# Patient Record
Sex: Female | Born: 1950 | Hispanic: No | Marital: Married | Smoking: Never smoker
Health system: Southern US, Community
[De-identification: ages and names within clinical notes are randomized; demographics above are authoritative.]

## PROBLEM LIST (undated history)

## (undated) DIAGNOSIS — I10 Essential (primary) hypertension: Secondary | ICD-10-CM

## (undated) DIAGNOSIS — C73 Malignant neoplasm of thyroid gland: Secondary | ICD-10-CM

## (undated) DIAGNOSIS — E039 Hypothyroidism, unspecified: Secondary | ICD-10-CM

## (undated) DIAGNOSIS — E781 Pure hyperglyceridemia: Secondary | ICD-10-CM

## (undated) DIAGNOSIS — G600 Hereditary motor and sensory neuropathy: Secondary | ICD-10-CM

## (undated) HISTORY — DX: Hypothyroidism, unspecified: E03.9

## (undated) HISTORY — DX: Essential (primary) hypertension: I10

## (undated) HISTORY — DX: Pure hyperglyceridemia: E78.1

## (undated) HISTORY — DX: Hereditary motor and sensory neuropathy: G60.0

## (undated) HISTORY — DX: Malignant neoplasm of thyroid gland: C73

---

## 1997-08-27 ENCOUNTER — Ambulatory Visit (HOSPITAL_COMMUNITY): Admission: RE | Admit: 1997-08-27 | Discharge: 1997-08-27 | Payer: Self-pay | Admitting: Otolaryngology

## 1997-09-07 ENCOUNTER — Ambulatory Visit: Admission: RE | Admit: 1997-09-07 | Discharge: 1997-09-07 | Payer: Self-pay | Admitting: Otolaryngology

## 1999-07-04 ENCOUNTER — Encounter (INDEPENDENT_AMBULATORY_CARE_PROVIDER_SITE_OTHER): Payer: Self-pay | Admitting: Specialist

## 1999-07-04 ENCOUNTER — Inpatient Hospital Stay (HOSPITAL_COMMUNITY): Admission: EM | Admit: 1999-07-04 | Discharge: 1999-07-05 | Payer: Self-pay | Admitting: Emergency Medicine

## 2001-03-04 ENCOUNTER — Encounter: Payer: Self-pay | Admitting: Emergency Medicine

## 2001-03-04 ENCOUNTER — Emergency Department (HOSPITAL_COMMUNITY): Admission: EM | Admit: 2001-03-04 | Discharge: 2001-03-04 | Payer: Self-pay | Admitting: Emergency Medicine

## 2003-08-08 ENCOUNTER — Encounter: Admission: RE | Admit: 2003-08-08 | Discharge: 2003-08-08 | Payer: Self-pay | Admitting: Family Medicine

## 2003-10-24 ENCOUNTER — Other Ambulatory Visit: Admission: RE | Admit: 2003-10-24 | Discharge: 2003-10-24 | Payer: Self-pay | Admitting: Family Medicine

## 2004-09-04 ENCOUNTER — Encounter: Admission: RE | Admit: 2004-09-04 | Discharge: 2004-09-04 | Payer: Self-pay | Admitting: Family Medicine

## 2004-10-24 ENCOUNTER — Other Ambulatory Visit: Admission: RE | Admit: 2004-10-24 | Discharge: 2004-10-24 | Payer: Self-pay | Admitting: Family Medicine

## 2004-10-28 ENCOUNTER — Ambulatory Visit (HOSPITAL_BASED_OUTPATIENT_CLINIC_OR_DEPARTMENT_OTHER): Admission: RE | Admit: 2004-10-28 | Discharge: 2004-10-28 | Payer: Self-pay | Admitting: Orthopedic Surgery

## 2004-10-28 ENCOUNTER — Ambulatory Visit (HOSPITAL_COMMUNITY): Admission: RE | Admit: 2004-10-28 | Discharge: 2004-10-28 | Payer: Self-pay | Admitting: Orthopedic Surgery

## 2005-12-08 ENCOUNTER — Other Ambulatory Visit: Admission: RE | Admit: 2005-12-08 | Discharge: 2005-12-08 | Payer: Self-pay | Admitting: Family Medicine

## 2005-12-11 ENCOUNTER — Encounter: Admission: RE | Admit: 2005-12-11 | Discharge: 2005-12-11 | Payer: Self-pay | Admitting: Family Medicine

## 2006-01-11 ENCOUNTER — Encounter: Admission: RE | Admit: 2006-01-11 | Discharge: 2006-01-11 | Payer: Self-pay | Admitting: Endocrinology

## 2006-01-11 ENCOUNTER — Other Ambulatory Visit: Admission: RE | Admit: 2006-01-11 | Discharge: 2006-01-11 | Payer: Self-pay | Admitting: Interventional Radiology

## 2006-01-11 ENCOUNTER — Encounter (INDEPENDENT_AMBULATORY_CARE_PROVIDER_SITE_OTHER): Payer: Self-pay | Admitting: Specialist

## 2006-03-29 ENCOUNTER — Encounter (INDEPENDENT_AMBULATORY_CARE_PROVIDER_SITE_OTHER): Payer: Self-pay | Admitting: *Deleted

## 2006-03-29 ENCOUNTER — Ambulatory Visit (HOSPITAL_COMMUNITY): Admission: RE | Admit: 2006-03-29 | Discharge: 2006-03-30 | Payer: Self-pay

## 2006-04-29 ENCOUNTER — Encounter: Admission: RE | Admit: 2006-04-29 | Discharge: 2006-04-29 | Payer: Self-pay | Admitting: Family Medicine

## 2006-10-18 ENCOUNTER — Other Ambulatory Visit: Admission: RE | Admit: 2006-10-18 | Discharge: 2006-10-18 | Payer: Self-pay | Admitting: Obstetrics and Gynecology

## 2007-03-11 ENCOUNTER — Encounter: Admission: RE | Admit: 2007-03-11 | Discharge: 2007-03-11 | Payer: Self-pay | Admitting: Family Medicine

## 2007-10-17 ENCOUNTER — Encounter: Admission: RE | Admit: 2007-10-17 | Discharge: 2007-10-17 | Payer: Self-pay | Admitting: Family Medicine

## 2007-12-13 ENCOUNTER — Encounter: Admission: RE | Admit: 2007-12-13 | Discharge: 2007-12-13 | Payer: Self-pay | Admitting: Family Medicine

## 2008-01-19 ENCOUNTER — Other Ambulatory Visit: Admission: RE | Admit: 2008-01-19 | Discharge: 2008-01-19 | Payer: Self-pay | Admitting: Family Medicine

## 2009-02-25 ENCOUNTER — Other Ambulatory Visit: Admission: RE | Admit: 2009-02-25 | Discharge: 2009-02-25 | Payer: Self-pay | Admitting: Family Medicine

## 2010-03-11 ENCOUNTER — Emergency Department (HOSPITAL_COMMUNITY)
Admission: EM | Admit: 2010-03-11 | Discharge: 2010-03-11 | Payer: Self-pay | Source: Home / Self Care | Admitting: Emergency Medicine

## 2010-04-09 ENCOUNTER — Encounter: Payer: Self-pay | Admitting: Family Medicine

## 2010-07-25 NOTE — Op Note (Signed)
NAMEDarrelyn Hillock           ACCOUNT NO.:  [id number]   MEDICAL RECORD NO.:  [id number]          PATIENT TYPE:  AMB   LOCATION:  DAY                          FACILITY:  [facility]   PHYSICIAN:  Lebron Conners, M.D.   DATE OF BIRTH:  [birth year]   DATE OF PROCEDURE:  03/29/2006  DATE OF DISCHARGE:                               OPERATIVE REPORT   PREOPERATIVE DIAGNOSIS:  Left thyroid nodules.   POSTOPERATIVE DIAGNOSIS:  Benign left thyroid nodules.   OPERATIVE:  Left thyroid lobectomy and excision of lymph nodes from the  pretracheal space.   SURGEON:  Dr. Lebron Conners.   ASSISTANT:  Dr. Leonie Man.   ANESTHESIA:  General.   SPECIMEN:  Left thyroid lobe and pretracheal lymph nodes.   ESTIMATED BLOOD LOSS:  100 mL.   COMPLICATIONS:  None.  The patient to PACU in good condition.   DESCRIPTION OF PROCEDURE:  After the patient was monitored and asleep,  positioned with slight extension of the neck and propping the shoulders  up on a roll, I prepped and draped the anterior neck and made a collar-  type incision about 5 cm in length centered on the midline about 1-2 cm  above the clavicle. I deepened that under the platysma.  I injured one  anterior jugular vein and ligated it.  I then developed flaps beneath  the platysma cephalad to the thyroid cartilage and caudad to the sternal  notch going under the sternocleidomastoid muscles slightly.  I put in a  Mahorner retractor then incised the midline of the neck and visualized  the isthmus of the thyroid.  The right lobe felt normal.  There was  nodularity of the left lobe.  I developed a space between the strap  muscles and the anterior surface of the thyroid and noted a couple of  small veins coursing laterally and clipped and divided them.  I then  dissected out the superior thyroid vessels ligating them in continuity  close to the thyroid gland and clipping them as well.  I then had good  mobility of the superior pole.  As  I dissected laterally,  I saw the  recurrent nerve early in the dissection.  It was fairly adherent to the  posterolateral surface of the thyroid gland and I very carefully  dissected it away.  I controlled bleeding in the area of the nerve with  clips placed parallel to it and application of Surgicel. I saw both  inferior and superior parathyroids and spared them.  I dissected the  thyroid gland medially and then with the last part of the dissection it  dissecting off the trachea with the Bovie.  I clamped across the isthmus  and removed the left lobe, oriented it with a suture and sent it for  frozen section.  I suture ligated the isthmus with 3-0 Vicryl.  I then  got good hemostasis with cautery and application of Surgicel.  I found a  couple of lymph nodes just inferior to the isthmus which was slightly  enlarged and I removed them and I also found a Delphian node just above  the  isthmus and excised that as well and sent them for permanent section  examination.  After I was satisfied with hemostasis and  the diagnosis that the nodule was evidently benign had been received, I  closed the midline with running 3-0 Vicryl and closed with the platysma  with running 3-0 Vicryl and closed the skin with running intracuticular  4-0 Vicryl reinforced by Steri-Strips.  The patient was stable  throughout the procedure.      Lebron Conners, M.D.  Electronically Signed     WB/MEDQ  D:  03/29/2006  T:  03/29/2006  Job:  [id number]   cc:   Donia Guiles, M.D.  Fax: [phone]

## 2010-07-25 NOTE — Op Note (Signed)
NAMEDarrelyn Hillock           ACCOUNT NO.:  [id number]   MEDICAL RECORD NO.:  [id number]          PATIENT TYPE:  AMB   LOCATION:  DSC                          FACILITY:  [facility]   PHYSICIAN:  Leonides Grills, M.D.     DATE OF BIRTH:  1950-10-01   DATE OF PROCEDURE:  10/28/2004  DATE OF DISCHARGE:                                 OPERATIVE REPORT   PREOPERATIVE DIAGNOSES:  1.  Left peroneus brevis tendon tear.  2.  Subluxing left peroneal tendon.  3.  Left peroneal tenosynovitis.   POSTOPERATIVE DIAGNOSES:  1.  Left peroneus brevis tendon tear.  2.  Subluxing left peroneal tendon.  3.  Left peroneal tenosynovitis.   OPERATION:  1.  Repair of left subluxing peroneal tendons without fibular osteotomy.  2.  Repair, left peroneus brevis tendon tear.  3.  Excision/debridement, left peroneus quadratus tendon.   ANESTHESIA:  General.   SURGEON:  Leonides Grills, M.D.   ASSISTANT:  Lianne Cure, P.A.   ESTIMATED BLOOD LOSS:  Minimal.   TOURNIQUET TIME:  Approximately 45 minutes.   COMPLICATIONS:  None.   DISPOSITION:  Stable to the PR.   INDICATIONS:  This is a 60 year old female with longstanding posterolateral  left ankle pain.  On MRI, she was found to have a tendon tear with  tendinopathy and with a subtle subluxation.  She was consented for the above  procedure.  All risks, which included infection, nerve and vessel injury,  system pain, worsening pain, weakness, prolonged recovery, possible future  surgery, were all explained.  Questions were encouraged and answered.   OPERATION:  Patient was brought to the operating room.  Placed in a supine  position.  After adequate general endotracheal anesthesia was administered  as well as Ancef 1 gm IV piggyback, the left lower extremity was then  prepped and draped in a sterile manner over a proximally placed thigh  tourniquet.  After the patient was placed in a lateral decubitus position  with the operative side up with axial  ___________ placed.  All bony  prominences were well padded.  Once the left lower extremity was prepped and  draped in the usual sterile manner, a curvilinear incision was then made  over the left lateral aspect of the ankle.  Dissection was carried down  through the skin.  Hemostasis was obtained.  The sural nerve was identified,  and femoral sural nerve neurolysis was then performed.  The nerve and soft  tissues posteriorly were then gently retracted.  The retinaculum over the  peroneal tendon was then identified proximally just 1-2 mm just posterior to  the lateral malleolus border.  An incision was then made in the retinaculum,  and the retinaculum was incised.  There was a tremendous amount of synovitis  within the peroneal tendon.  Both the peroneus brevis and peroneus longus  tendons had tendinopathy, more in the longus than the brevis.  There was no  distinct tear in the longus tendon that we could identify.  The inferior  peroneal retinaculum was also tight as well, and this was released and  debrided.  The peroneus brevis  tendon had a tear, and then this was repaired  with a cannulated-type stitch using 5-0 nylon suture with outstanding  repair.  Prior to repairing it, the tear was debrided.  There was a peroneus  quadratus tendon as well as muscle bed traversing the peroneal compartment  and this, as well as the tenosynovitis around the area was completely  debrided and debulked.  We then created a bed in the posterolateral quarter  of the lateral malleolus with the curved 1/4 inch osteotome and a rongeur.  We then made multiple drill holes to pass the suture and with #2 fiber wire,  the peroneal retinaculum was then repaired, and this had an outstanding  repair.  Then 2-0 fiber wire was then used to finish up the peroneal  retinaculum repair, protecting the peroneal tendons with the Therapist, nutritional.  Prior to repair, the posterior aspect of the lateral malleolus was palpated.   This had adequate cavity, and there was no palpable bony prominence.  Right  after the repair, the ankle was ranged.  The tendons moved freely.  There  was no binding.  The area was copiously irrigated with normal saline.  The  tourniquet was inflated.  Hemostasis was obtained.  Subcu was closed with 3-  0 Vicryls, cognizant of the sural nerve.  Skin was closed with 4-0 nylon.  Sterile dressing was applied.  A modified Jones dressing was applied.  Patient was stable to the PR.      Leonides Grills, M.D.  Electronically Signed     PB/MEDQ  D:  10/28/2004  T:  10/28/2004  Job:  [id number]

## 2010-07-25 NOTE — Op Note (Signed)
[facility]  Patient:    Julia Cole                    MRN: [id number] Proc. Date: 07/04/99 Adm. Date:  [id number] Disc. Date: [id number] Attending:  Meredith Leeds                           Operative Report  PREOPERATIVE DIAGNOSIS:  Acute appendicitis.  POSTOPERATIVE DIAGNOSIS:  Acute appendicitis.  OPERATION PERFORMED:  Laparoscopic appendectomy.  SURGEON:  Zigmund Daniel, M.D.  ANESTHESIA:  General.  DESCRIPTION OF PROCEDURE:  After adequate monitoring and anesthesia and routine  preparation and draping of the abdomen and after placement of a Foley catheter, I made a small infraumbilical incision and bluntly entered the peritoneum after incising the fascia longitudinally and then placed a 0 Vicryl pursestring suture and secured a Hasson cannula.  I inflated the abdomen with CO2 and saw inflammation lateral to the cecum and saw no other abnormalities.  I put in a 5 mm right upper quadrant port and a 12 mm left lower quadrant port and dissected away the adhesions.  The appendix was adherent slightly behind and lateral to the cecum ut I was able to dissect it away with little bleeding.  It was moderately inflamed in the midportion and at the tip but the base was healthy.  After dissecting it up  until I had a nice window through between the artery and lateral to the artery, I stapled the appendix and the appendiceal artery with one firing of the endoscopic stapler. That amputated it nicely.  The cecum appeared to be healthy. Hemostasis was good.  I removed the appendix through the left lower quadrant port by pulling it up into the cannula and removing the cannula.  I inspected the sites and found that there was no bleeding.  I removed the CO2 and tied the pursestring suture,  then closed all the skin incisions with intracuticular 4-0 Vicryl and Steri-Strips. The patient tolerated the operation well. DD:  07/04/99 TD:   07/08/99 Job: [id number]

## 2011-07-16 ENCOUNTER — Other Ambulatory Visit: Payer: Self-pay | Admitting: Family Medicine

## 2011-07-16 DIAGNOSIS — E049 Nontoxic goiter, unspecified: Secondary | ICD-10-CM

## 2011-07-17 ENCOUNTER — Ambulatory Visit
Admission: RE | Admit: 2011-07-17 | Discharge: 2011-07-17 | Disposition: A | Discharge status: Home / Self Care | Payer: Medicare Other | Source: Ambulatory Visit | Attending: Family Medicine | Admitting: Family Medicine

## 2011-07-17 DIAGNOSIS — E049 Nontoxic goiter, unspecified: Secondary | ICD-10-CM

## 2011-08-13 ENCOUNTER — Other Ambulatory Visit (HOSPITAL_COMMUNITY)
Admission: RE | Admit: 2011-08-13 | Discharge: 2011-08-13 | Disposition: A | Discharge status: Home / Self Care | Payer: Medicare Other | Source: Ambulatory Visit | Attending: Family Medicine | Admitting: Family Medicine

## 2011-08-13 ENCOUNTER — Other Ambulatory Visit: Payer: Self-pay | Admitting: Family Medicine

## 2011-08-13 DIAGNOSIS — Z124 Encounter for screening for malignant neoplasm of cervix: Secondary | ICD-10-CM | POA: Insufficient documentation

## 2011-12-31 IMAGING — CR DG LUMBAR SPINE COMPLETE 4+V
5 series · 5 of 5 positions shown · non-contrast
Comparison: None

CLINICAL DATA: Fell.  Back pain.

LUMBAR SPINE - COMPLETE 4+ VIEW

[t l-spine a.p.]
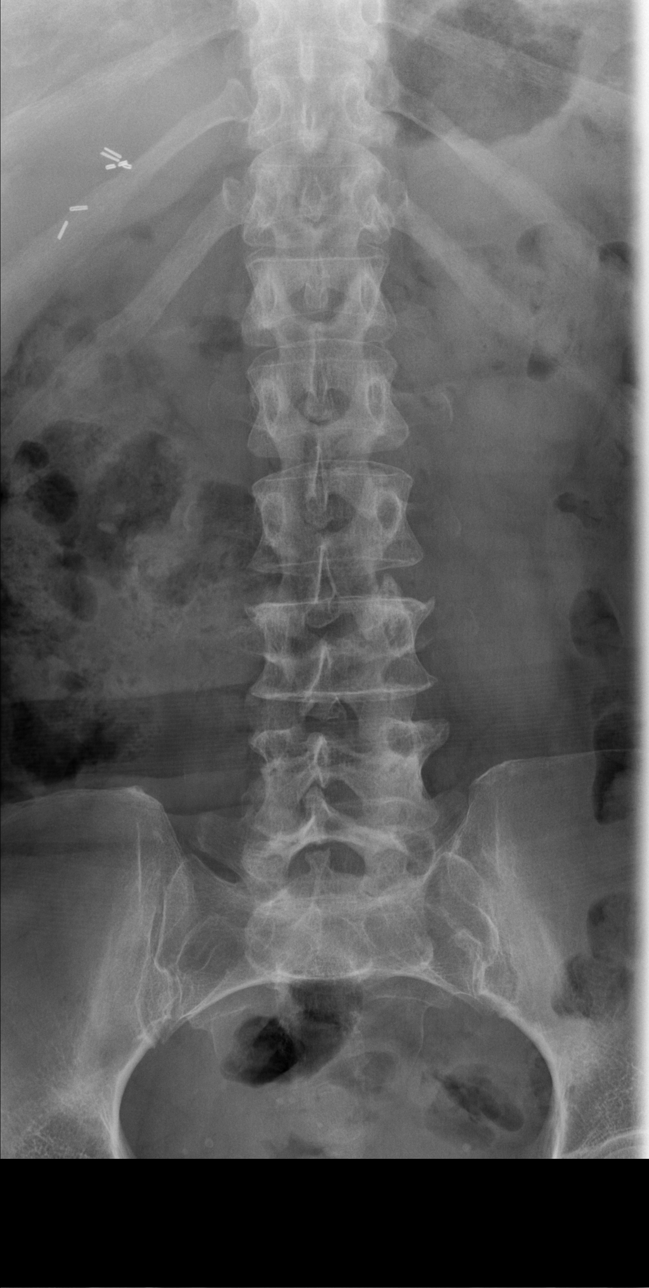

[t l-spine oblique exposure (1 of 2)]
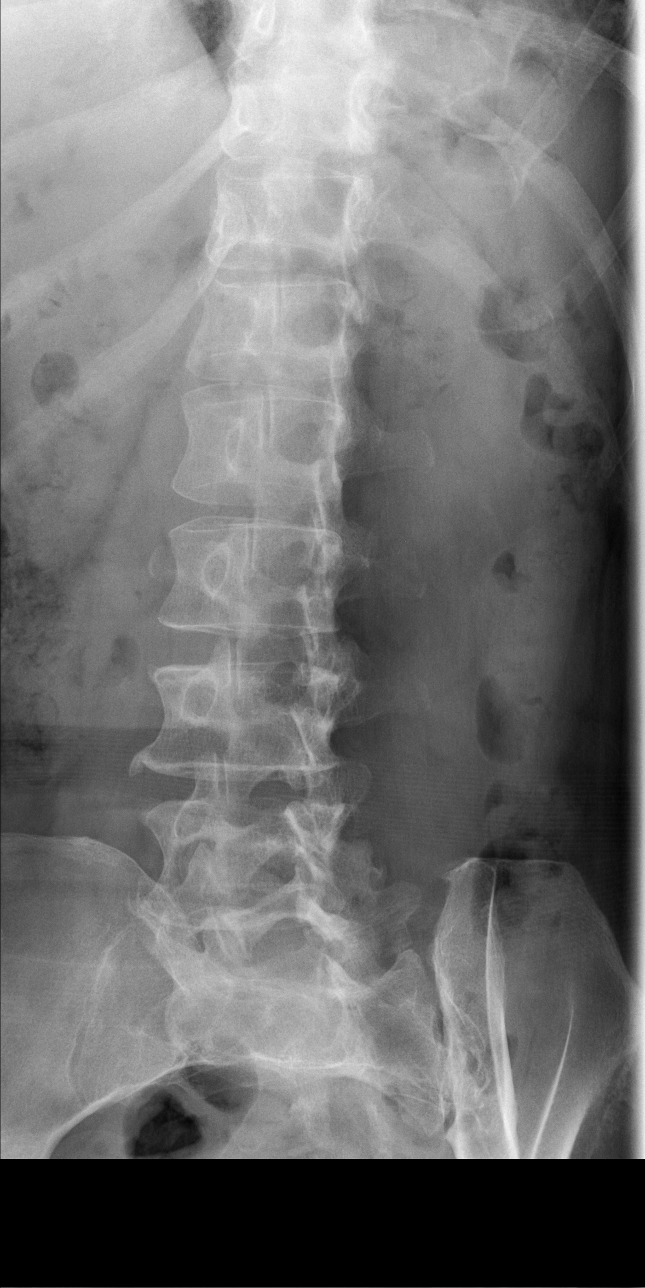

[t l-spine oblique exposure (2 of 2)]
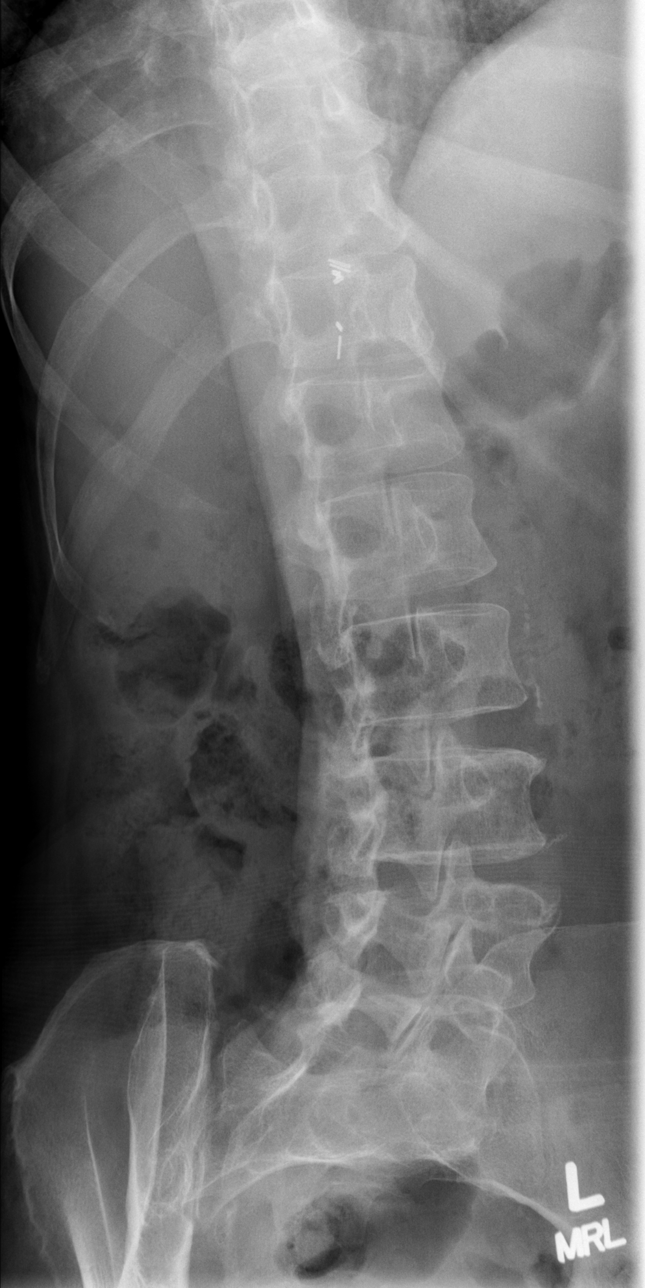

[t l-spine lat]
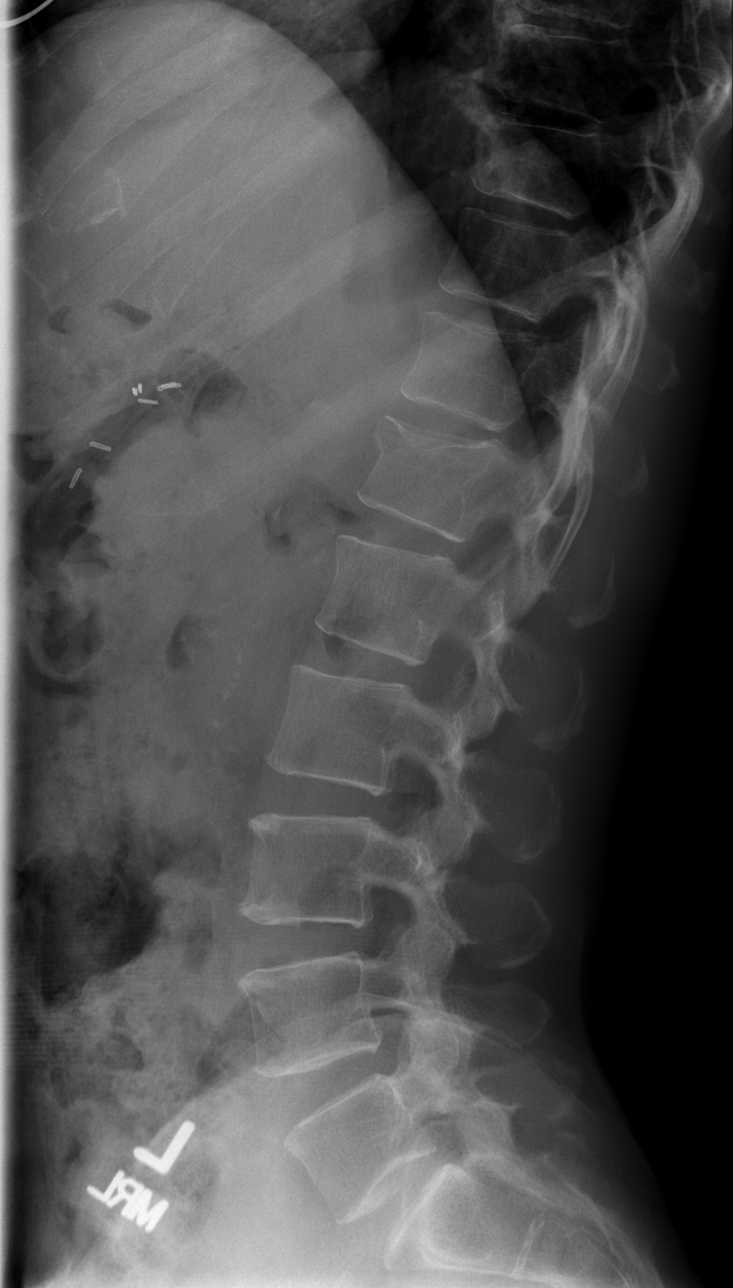

[t l-spine l5-s1 spot]
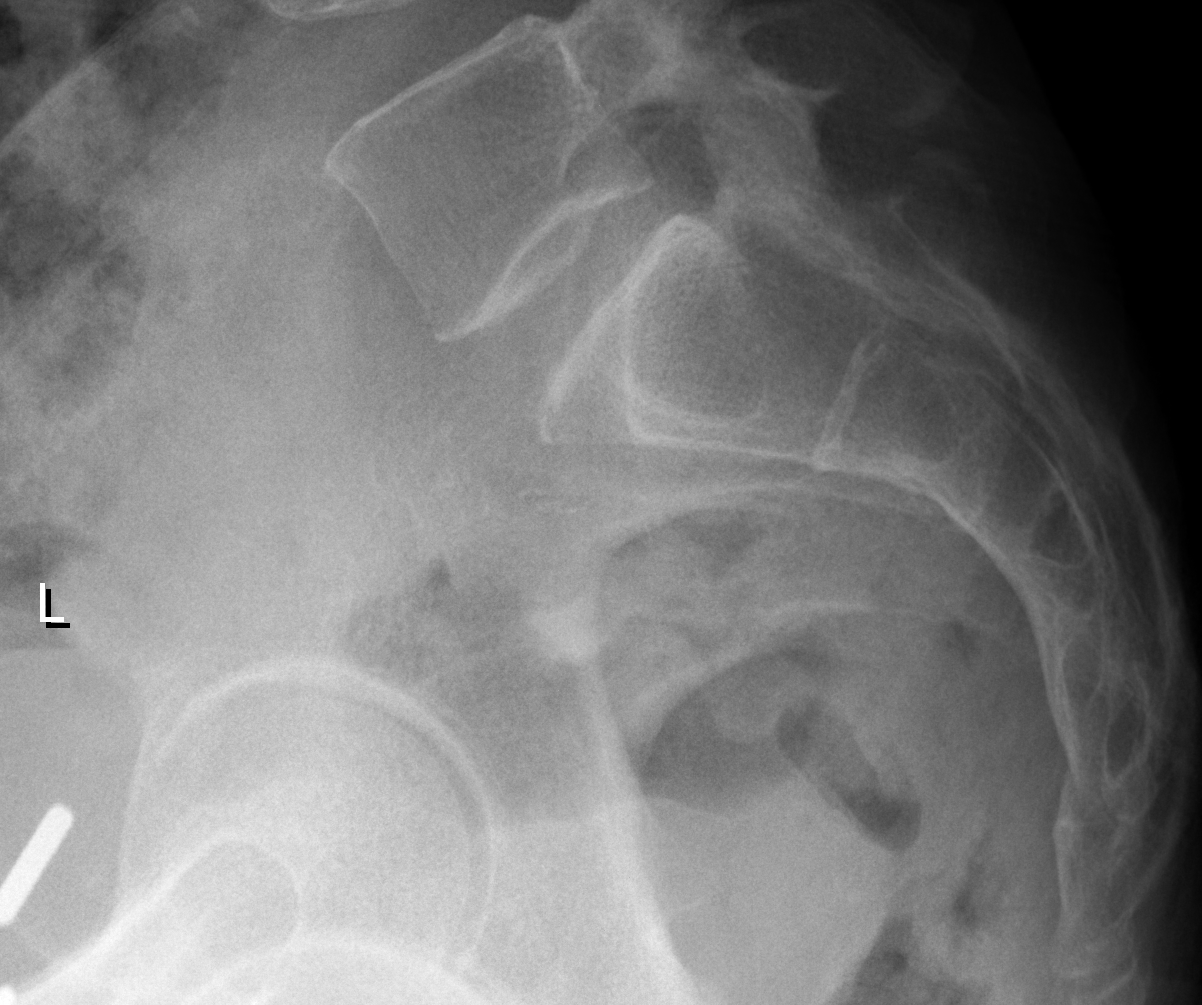

[5 of 5 positions shown; findings below may reference images not displayed]

FINDINGS: The lateral film demonstrates normal alignment.  There is
lumbarization of the S1 vertebral body.  There is a mild
compression fracture of L1.  No retropulsion.  No pars defects.
IMPRESSION: 1.  L1 compression fracture.
2.  Lumbarization of S1.

## 2012-08-18 ENCOUNTER — Other Ambulatory Visit (HOSPITAL_COMMUNITY)
Admission: RE | Admit: 2012-08-18 | Discharge: 2012-08-18 | Disposition: A | Discharge status: Home / Self Care | Payer: Medicare Other | Source: Ambulatory Visit | Attending: Family Medicine | Admitting: Family Medicine

## 2012-08-18 ENCOUNTER — Other Ambulatory Visit: Payer: Self-pay | Admitting: Family Medicine

## 2012-08-18 DIAGNOSIS — Z124 Encounter for screening for malignant neoplasm of cervix: Secondary | ICD-10-CM | POA: Insufficient documentation

## 2012-08-18 DIAGNOSIS — Z1151 Encounter for screening for human papillomavirus (HPV): Secondary | ICD-10-CM | POA: Insufficient documentation

## 2014-12-18 ENCOUNTER — Other Ambulatory Visit: Payer: Self-pay | Admitting: Family Medicine

## 2014-12-18 ENCOUNTER — Other Ambulatory Visit (HOSPITAL_COMMUNITY)
Admission: RE | Admit: 2014-12-18 | Discharge: 2014-12-18 | Disposition: A | Discharge status: Home / Self Care | Payer: Medicare Other | Source: Ambulatory Visit | Attending: Family Medicine | Admitting: Family Medicine

## 2014-12-18 DIAGNOSIS — Z124 Encounter for screening for malignant neoplasm of cervix: Secondary | ICD-10-CM | POA: Insufficient documentation

## 2014-12-18 DIAGNOSIS — Z1151 Encounter for screening for human papillomavirus (HPV): Secondary | ICD-10-CM | POA: Insufficient documentation

## 2014-12-21 LAB — CYTOLOGY - PAP

## 2015-11-14 ENCOUNTER — Other Ambulatory Visit: Payer: Self-pay | Admitting: Endocrinology

## 2015-11-14 DIAGNOSIS — C73 Malignant neoplasm of thyroid gland: Secondary | ICD-10-CM

## 2015-11-28 ENCOUNTER — Ambulatory Visit
Admission: RE | Admit: 2015-11-28 | Discharge: 2015-11-28 | Disposition: A | Discharge status: Home / Self Care | Payer: Medicare Other | Source: Ambulatory Visit | Attending: Endocrinology | Admitting: Endocrinology

## 2015-11-28 DIAGNOSIS — C73 Malignant neoplasm of thyroid gland: Secondary | ICD-10-CM

## 2017-01-06 ENCOUNTER — Other Ambulatory Visit: Payer: Self-pay | Admitting: Family Medicine

## 2017-01-06 ENCOUNTER — Other Ambulatory Visit (HOSPITAL_COMMUNITY)
Admission: RE | Admit: 2017-01-06 | Discharge: 2017-01-06 | Disposition: A | Discharge status: Home / Self Care | Payer: Medicare Other | Source: Ambulatory Visit | Attending: Family Medicine | Admitting: Family Medicine

## 2017-01-06 DIAGNOSIS — Z124 Encounter for screening for malignant neoplasm of cervix: Secondary | ICD-10-CM | POA: Diagnosis present

## 2017-01-08 LAB — CYTOLOGY - PAP
DIAGNOSIS: NEGATIVE
HPV: NOT DETECTED

## 2017-08-10 ENCOUNTER — Other Ambulatory Visit: Payer: Self-pay | Admitting: Gastroenterology

## 2017-08-10 DIAGNOSIS — R1310 Dysphagia, unspecified: Secondary | ICD-10-CM

## 2017-08-11 ENCOUNTER — Ambulatory Visit
Admission: RE | Admit: 2017-08-11 | Discharge: 2017-08-11 | Disposition: A | Discharge status: Home / Self Care | Payer: Medicare Other | Source: Ambulatory Visit | Attending: Gastroenterology | Admitting: Gastroenterology

## 2017-08-11 DIAGNOSIS — R1310 Dysphagia, unspecified: Secondary | ICD-10-CM

## 2019-03-07 ENCOUNTER — Other Ambulatory Visit: Payer: Self-pay | Admitting: Family Medicine

## 2019-03-07 DIAGNOSIS — I739 Peripheral vascular disease, unspecified: Secondary | ICD-10-CM

## 2019-03-14 ENCOUNTER — Ambulatory Visit
Admission: RE | Admit: 2019-03-14 | Discharge: 2019-03-14 | Disposition: A | Discharge status: Home / Self Care | Payer: Medicare Other | Source: Ambulatory Visit | Attending: Family Medicine | Admitting: Family Medicine

## 2019-03-14 DIAGNOSIS — I739 Peripheral vascular disease, unspecified: Secondary | ICD-10-CM

## 2019-11-20 ENCOUNTER — Other Ambulatory Visit: Payer: Self-pay | Admitting: Endocrinology

## 2019-11-20 DIAGNOSIS — C73 Malignant neoplasm of thyroid gland: Secondary | ICD-10-CM

## 2019-12-01 ENCOUNTER — Ambulatory Visit
Admission: RE | Admit: 2019-12-01 | Discharge: 2019-12-01 | Disposition: A | Discharge status: Home / Self Care | Payer: Medicare Other | Source: Ambulatory Visit | Attending: Endocrinology | Admitting: Endocrinology

## 2019-12-01 DIAGNOSIS — C73 Malignant neoplasm of thyroid gland: Secondary | ICD-10-CM

## 2020-02-16 ENCOUNTER — Other Ambulatory Visit: Payer: Self-pay

## 2020-02-16 ENCOUNTER — Ambulatory Visit: Payer: Medicare Other | Admitting: Internal Medicine

## 2020-02-16 ENCOUNTER — Encounter: Payer: Self-pay | Admitting: Internal Medicine

## 2020-02-16 VITALS — BP 114/70 | HR 78 | Ht 61.5 in | Wt 147.4 lb

## 2020-02-16 DIAGNOSIS — E039 Hypothyroidism, unspecified: Secondary | ICD-10-CM

## 2020-02-16 DIAGNOSIS — E783 Hyperchylomicronemia: Secondary | ICD-10-CM

## 2020-02-16 DIAGNOSIS — Z8249 Family history of ischemic heart disease and other diseases of the circulatory system: Secondary | ICD-10-CM

## 2020-02-16 NOTE — Patient Instructions (Addendum)
Medication Instructions:  Your physician recommends that you continue on your current medications as directed. Please refer to the Current Medication list given to you today.  *If you need a refill on your cardiac medications before your next appointment, please call your pharmacy*   Lab Work: Your physician recommends that you return for lab work in: Lipid profile/NMR/Apo B  If you have labs (blood work) drawn today and your tests are completely normal, you will receive your results only by: Marland Kitchen MyChart Message (if you have MyChart) OR . A paper copy in the mail If you have any lab test that is abnormal or we need to change your treatment, we will call you to review the results.   Follow-Up: At [facility] you and your health needs are our priority.  As part of our continuing mission to provide you with exceptional heart care, we have created designated Provider Care Teams.  These Care Teams include your primary Cardiologist (physician) and Advanced Practice Providers (APPs -  Physician Assistants and Nurse Practitioners) who all work together to provide you with the care you need, when you need it.  We recommend signing up for the patient portal called "MyChart".  Sign up information is provided on this After Visit Summary.  MyChart is used to connect with patients for Virtual Visits (Telemedicine).  Patients are able to view lab/test results, encounter notes, upcoming appointments, etc.  Non-urgent messages can be sent to your provider as well.   To learn more about what you can do with MyChart, go to [email or web address].    Your next appointment:   3 month(s)  The format for your next appointment:   In Person  Provider:   K. Mali Hilty, MD  Follow up made with the lipid clinic.

## 2020-02-16 NOTE — Progress Notes (Signed)
LIPID CLINIC CONSULT NOTE  Chief Complaint:  High triglycerides  Primary Care Physician: Mayra Neer, MD  Primary Cardiologist:  No primary care provider on file.  HPI:  Julia Cole is a 69 y.o. female who is being seen today for the evaluation of high triglycerides at the request of Mayra Neer, MD.  This is a pleasant 69 year old female kindly referred by Dr. Brigitte Pulse for evaluation and management of high triglycerides.  This is apparently a longstanding problem and she has been on different medications for in the past.  Particularly she has been on simvastatin and more recently on fenofibrate.  Her last lipids were assessed in October showing total cholesterol 219, HDL 40, LDL 53 and triglycerides 897.  She reports triglycerides over thousand in the past.  She says that her father had heart disease and died of an MI in his 19s but was a heavy smoker.  He also had high cholesterol.  She is physically active still participating in spin classes but reports she gets short of breath and fatigue much easier.  She also has a history of thyroid disorder with partial thyroidectomy on thyroid replacement followed by Dr. Chalmers Cater.  She denies tobacco or alcohol use.  She reports a varied diet but does not necessarily restrict saturated fats to low levels.  She denies history of pancreatitis.  PMHx:  Past Medical History:  Diagnosis Date  . CMT (Charcot-Marie-Tooth disease)   . High triglycerides   . Hypertension   . Hypothyroidism   . Thyroid cancer ([place])     History reviewed. No pertinent surgical history.  FAMHx:  Family History  Problem Relation Age of Onset  . Heart attack Mother     SOCHx:   reports that she has never smoked. She has never used smokeless tobacco. No history on file for alcohol use and drug use.  ALLERGIES:  No Known Allergies  ROS: Pertinent items noted in HPI and remainder of comprehensive ROS otherwise negative.  HOME MEDS: Current Outpatient  Medications on File Prior to Visit  Medication Sig Dispense Refill  . ASPIRIN 81 PO 1 tablet    . cholecalciferol (VITAMIN D3) 25 MCG (1000 UNIT) tablet 1 tablet    . fenofibrate 160 MG tablet     . levothyroxine (SYNTHROID) 25 MCG tablet Take 25 mcg by mouth daily.    Marland Kitchen levothyroxine (SYNTHROID) 88 MCG tablet TAKE 1 TABLET BY MOUTH DAILY ON AN EMPTY STOMACH IN THE MORNING 90    . linaclotide (LINZESS) 72 MCG capsule prn    . metroNIDAZOLE (FLAGYL) 250 MG tablet Take 250 mg by mouth 3 (three) times daily.    . ramipril (ALTACE) 10 MG capsule 1 capsule    . simvastatin (ZOCOR) 40 MG tablet Take 40 mg by mouth at bedtime.    . triamterene-hydrochlorothiazide (MAXZIDE-25) 37.5-25 MG tablet 1 tablet in the morning     No current facility-administered medications on file prior to visit.    LABS/IMAGING: No results found for this or any previous visit (from the past 48 hour(s)). No results found.  LIPID PANEL: No results found for: CHOL, TRIG, HDL, CHOLHDL, VLDL, LDLCALC, LDLDIRECT  WEIGHTS: Wt Readings from Last 3 Encounters:  02/16/20 147 lb 6.4 oz (66.9 kg)    VITALS: BP 114/70   Pulse 78   Ht 5' 1.5" (1.562 m)   Wt 147 lb 6.4 oz (66.9 kg)   BMI 27.40 kg/m   EXAM: General appearance: alert and no distress Neck: no carotid  bruit, no JVD and thyroid not enlarged, symmetric, no tenderness/mass/nodules Lungs: clear to auscultation bilaterally Heart: regular rate and rhythm, S1, S2 normal, no murmur, click, rub or gallop Abdomen: soft, non-tender; bowel sounds normal; no masses,  no organomegaly Extremities: extremities normal, atraumatic, no cyanosis or edema Pulses: 2+ and symmetric Skin: Skin color, texture, turgor normal. No rashes or lesions Neurologic: Grossly normal Psych: Pleasant  EKG: Deferred  ASSESSMENT: 1. Hyperchylomicronemia 2. Hypothyroidism with history of thyroid CA and prior partial thyroidectomy 3. Family history of premature CAD in her  father  PLAN: 44.   [name] continues to have persistently elevated triglycerides on a statin more recently a fibrate.  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  I will be interested in looking get a lipid NMR as well as an ApoB level.  She could also benefit from omega-3 fatty acids namely Vascepa 2 g twice daily.  This will require prior authorization and may be cost prohibitive.  If so we could consider Lovaza or over-the-counter fish oil which would be less desirable given lack of efficacy data.  In addition, she may be a candidate for 1 of 2 different clinical trials that are available currently at [facility].  I will have our research nurses reach out to her with this option.  Thanks again for the interesting referral.  Follow-up with me in about 3 to 4 months.  Pixie Casino, MD, [facility] Director of the [facility] Lip[facility] &  Cardiovascular Risk Reduction Clinic Diplomate of the American Board of Clinical Lipidology Attending Cardiologist  Direct Dial: [phone]  Fax: [phone]  Website:  [email or web address] Osgood Shaneya Taketa 02/16/2020, 3:07 PM

## 2020-02-19 ENCOUNTER — Telehealth: Payer: Self-pay | Admitting: Internal Medicine

## 2020-02-19 MED ORDER — ICOSAPENT ETHYL 1 G PO CAPS: 2.0000 g | ORAL_CAPSULE | Freq: Two times a day (BID) | ORAL | 11 refills | Status: DC

## 2020-02-19 NOTE — Telephone Encounter (Signed)
Message from plan via CMM: "This medication or product is on your plan's list of covered drugs. Prior authorization is not required at this time."  Rx sent to pharmacy  Message sent to patient via [place]

## 2020-02-19 NOTE — Telephone Encounter (Signed)
PA for vascepa submitted via CMM (Key: [id number])

## 2020-02-19 NOTE — Addendum Note (Signed)
Addended by: Fidel Levy on: 02/19/2020 11:29 AM   Modules accepted: Orders

## 2020-02-26 NOTE — Telephone Encounter (Signed)
No patient assistance options for [place] funds closed  Message sent to MD

## 2020-02-26 NOTE — Telephone Encounter (Signed)
Pt c/o medication issue:  1. Name of Medication: Vascepa  2. How are you currently taking this medication (dosage and times per day)? N/A  3. Are you having a reaction (difficulty breathing--STAT)? No  4. What is your medication issue? Patient is following up. She states she is unable to afford the medication. She is inquiring about assistance with purchasing or an alternative medication. Please return call to discuss further.

## 2020-02-27 MED ORDER — OMEGA-3-ACID ETHYL ESTERS 1 G PO CAPS: 2.0000 g | ORAL_CAPSULE | Freq: Two times a day (BID) | ORAL | 3 refills | Status: AC

## 2020-02-27 NOTE — Telephone Encounter (Signed)
Spoke with patient about med change. She agreed w/plan. Rx(s) sent to pharmacy electronically.

## 2020-02-27 NOTE — Telephone Encounter (Signed)
Ok .. try generic Lovaza 2G BID.  Dr Lemmie Evens

## 2020-02-27 NOTE — Addendum Note (Signed)
Addended by: Fidel Levy on: 02/27/2020 11:09 AM   Modules accepted: Orders

## 2020-04-04 ENCOUNTER — Ambulatory Visit: Payer: Medicare Other | Admitting: Internal Medicine

## 2020-05-02 DIAGNOSIS — E039 Hypothyroidism, unspecified: Secondary | ICD-10-CM | POA: Diagnosis not present

## 2020-05-02 DIAGNOSIS — Z85850 Personal history of malignant neoplasm of thyroid: Secondary | ICD-10-CM | POA: Diagnosis not present

## 2020-05-02 DIAGNOSIS — E783 Hyperchylomicronemia: Secondary | ICD-10-CM | POA: Diagnosis not present

## 2020-05-02 DIAGNOSIS — Z0000 Encounter for general adult medical examination without abnormal findings: Secondary | ICD-10-CM | POA: Diagnosis not present

## 2020-05-02 DIAGNOSIS — J301 Allergic rhinitis due to pollen: Secondary | ICD-10-CM | POA: Diagnosis not present

## 2020-05-02 DIAGNOSIS — R7309 Other abnormal glucose: Secondary | ICD-10-CM | POA: Diagnosis not present

## 2020-05-02 DIAGNOSIS — M810 Age-related osteoporosis without current pathological fracture: Secondary | ICD-10-CM | POA: Diagnosis not present

## 2020-05-02 DIAGNOSIS — I10 Essential (primary) hypertension: Secondary | ICD-10-CM | POA: Diagnosis not present

## 2020-05-20 ENCOUNTER — Ambulatory Visit: Payer: Medicare Other | Admitting: Internal Medicine

## 2020-05-20 ENCOUNTER — Encounter: Payer: Self-pay | Admitting: Internal Medicine

## 2020-05-20 ENCOUNTER — Other Ambulatory Visit: Payer: Self-pay

## 2020-05-20 VITALS — BP 123/71 | HR 75 | Ht 61.5 in | Wt 148.0 lb

## 2020-05-20 DIAGNOSIS — E783 Hyperchylomicronemia: Secondary | ICD-10-CM

## 2020-05-20 DIAGNOSIS — Z8249 Family history of ischemic heart disease and other diseases of the circulatory system: Secondary | ICD-10-CM | POA: Diagnosis not present

## 2020-05-20 NOTE — Patient Instructions (Signed)
Medication Instructions:  Your physician recommends that you continue on your current medications as directed. Please refer to the Current Medication list given to you today.  *If you need a refill on your cardiac medications before your next appointment, please call your pharmacy*   Lab Work: Your physician recommends that you return for lab work in: next week: lipid profile  If you have labs (blood work) drawn today and your tests are completely normal, you will receive your results only by: Marland Kitchen MyChart Message (if you have MyChart) OR . A paper copy in the mail If you have any lab test that is abnormal or we need to change your treatment, we will call you to review the results.   Testing/Procedures: Dr. Debara Pickett has ordered a CT coronary calcium score. This test is done at [address]. This is $99 out of pocket.   Coronary CalciumScan A coronary calcium scan is an imaging test used to look for deposits of calcium and other fatty materials (plaques) in the inner lining of the blood vessels of the heart (coronary arteries). These deposits of calcium and plaques can partly clog and narrow the coronary arteries without producing any symptoms or warning signs. This puts a person at risk for a heart attack. This test can detect these deposits before symptoms develop. Tell a health care provider about:  Any allergies you have.  All medicines you are taking, including vitamins, herbs, eye drops, creams, and over-the-counter medicines.  Any problems you or family members have had with anesthetic medicines.  Any blood disorders you have.  Any surgeries you have had.  Any medical conditions you have.  Whether you are pregnant or may be pregnant. What are the risks? Generally, this is a safe procedure. However, problems may occur, including:  Harm to a pregnant woman and her unborn baby. This test involves the use of radiation. Radiation exposure can be dangerous to a  pregnant woman and her unborn baby. If you are pregnant, you generally should not have this procedure done.  Slight increase in the risk of cancer. This is because of the radiation involved in the test. What happens before the procedure? No preparation is needed for this procedure. What happens during the procedure?  You will undress and remove any jewelry around your neck or chest.  You will put on a hospital gown.  Sticky electrodes will be placed on your chest. The electrodes will be connected to an electrocardiogram (ECG) machine to record a tracing of the electrical activity of your heart.  A CT scanner will take pictures of your heart. During this time, you will be asked to lie still and hold your breath for 2-3 seconds while a picture of your heart is being taken. The procedure may vary among health care providers and hospitals. What happens after the procedure?  You can get dressed.  You can return to your normal activities.  It is up to you to get the results of your test. Ask your health care provider, or the department that is doing the test, when your results will be ready. Summary  A coronary calcium scan is an imaging test used to look for deposits of calcium and other fatty materials (plaques) in the inner lining of the blood vessels of the heart (coronary arteries).  Generally, this is a safe procedure. Tell your health care provider if you are pregnant or may be pregnant.  No preparation is needed for this procedure.  A CT  scanner will take pictures of your heart.  You can return to your normal activities after the scan is done. This information is not intended to replace advice given to you by your health care provider. Make sure you discuss any questions you have with your health care provider. Document Released: 08/22/2007 Document Revised: 01/13/2016 Document Reviewed: 01/13/2016 Elsevier Interactive Patient Education  2017 [place]: At [facility], you and your health needs are our priority.  As part of our continuing mission to provide you with exceptional heart care, we have created designated Provider Care Teams.  These Care Teams include your primary Cardiologist (physician) and Advanced Practice Providers (APPs -  Physician Assistants and Nurse Practitioners) who all work together to provide you with the care you need, when you need it.  We recommend signing up for the patient portal called "MyChart".  Sign up information is provided on this After Visit Summary.  MyChart is used to connect with patients for Virtual Visits (Telemedicine).  Patients are able to view lab/test results, encounter notes, upcoming appointments, etc.  Non-urgent messages can be sent to your provider as well.   To learn more about what you can do with MyChart, go to [email or web address].    Your next appointment:   3 month(s)  The format for your next appointment:   In Person  Provider:   K. Mali Hilty, MD   Other Instructions In the lipid clinic.

## 2020-05-20 NOTE — Progress Notes (Signed)
LIPID CLINIC CONSULT NOTE  Chief Complaint:  Follow-up high triglycerides  Primary Care Physician: Mayra Neer, MD  Primary Cardiologist:  No primary care provider on file.  HPI:  Julia Cole is a 70 y.o. female who is being seen today for the evaluation of high triglycerides at the request of Mayra Neer, MD.  This is a pleasant 70 year old female kindly referred by Dr. Brigitte Pulse for evaluation and management of high triglycerides.  This is apparently a longstanding problem and she has been on different medications for in the past.  Particularly she has been on simvastatin and more recently on fenofibrate.  Her last lipids were assessed in October showing total cholesterol 219, HDL 40, LDL 53 and triglycerides 897.  She reports triglycerides over thousand in the past.  She says that her father had heart disease and died of an MI in his 69s but was a heavy smoker.  He also had high cholesterol.  She is physically active still participating in spin classes but reports she gets short of breath and fatigue much easier.  She also has a history of thyroid disorder with partial thyroidectomy on thyroid replacement followed by Dr. Chalmers Cater.  She denies tobacco or alcohol use.  She reports a varied diet but does not necessarily restrict saturated fats to low levels.  She denies history of pancreatitis.  05/20/2020  [name] returns today for follow-up.  Unfortunately she says she was never contacted about Lovaza.  She said she tried to contact the pharmacy but never got a call back.  Subsequently she decided to start taking over-the-counter fish oil on her own.  She remains on fenofibrate and simvastatin.  She was also supposed to have lipids prior to follow-up but she did not have those performed as well.  She is not fasting today.  PMHx:  Past Medical History:  Diagnosis Date  . CMT (Charcot-Marie-Tooth disease)   . High triglycerides   . Hypertension   . Hypothyroidism   . Thyroid  cancer ([place])     No past surgical history on file.  FAMHx:  Family History  Problem Relation Age of Onset  . Heart attack Mother     SOCHx:   reports that she has never smoked. She has never used smokeless tobacco. No history on file for alcohol use and drug use.  ALLERGIES:  No Known Allergies  ROS: Pertinent items noted in HPI and remainder of comprehensive ROS otherwise negative.  HOME MEDS: Current Outpatient Medications on File Prior to Visit  Medication Sig Dispense Refill  . ASPIRIN 81 PO 1 tablet    . cholecalciferol (VITAMIN D3) 25 MCG (1000 UNIT) tablet 1 tablet    . fenofibrate 160 MG tablet     . levothyroxine (SYNTHROID) 88 MCG tablet TAKE 1 TABLET BY MOUTH DAILY ON AN EMPTY STOMACH IN THE MORNING 90    . linaclotide (LINZESS) 72 MCG capsule prn    . metroNIDAZOLE (FLAGYL) 250 MG tablet Take 250 mg by mouth 3 (three) times daily.    Marland Kitchen omega-3 acid ethyl esters (LOVAZA) 1 g capsule Take 2 capsules (2 g total) by mouth 2 (two) times daily. 360 capsule 3  . ramipril (ALTACE) 10 MG capsule 1 capsule    . simvastatin (ZOCOR) 40 MG tablet Take 40 mg by mouth at bedtime.    . triamterene-hydrochlorothiazide (MAXZIDE-25) 37.5-25 MG tablet 1 tablet in the morning     No current facility-administered medications on file prior to visit.  LABS/IMAGING: No results found for this or any previous visit (from the past 48 hour(s)). No results found.  LIPID PANEL: No results found for: CHOL, TRIG, HDL, CHOLHDL, VLDL, LDLCALC, LDLDIRECT  WEIGHTS: Wt Readings from Last 3 Encounters:  05/20/20 148 lb (67.1 kg)  02/16/20 147 lb 6.4 oz (66.9 kg)    VITALS: BP 123/71   Pulse 75   Ht 5' 1.5" (1.562 m)   Wt 148 lb (67.1 kg)   SpO2 97%   BMI 27.51 kg/m   EXAM: Deferred  EKG: Deferred  ASSESSMENT: 1. Hyperchylomicronemia 2. Hypothyroidism with history of thyroid CA and prior partial thyroidectomy 3. Family history of premature CAD in her father  PLAN: 1.    Ms. Dillehay has high triglycerides, possibly a hyper chylomicronemia with triglycerides in the 800s.  She is try to make dietary changes.  She is not using over-the-counter Lovaza and Vascepa was cost prohibitive.  She is taking fish oil.  Will check fasting lipids likely tomorrow if she can return in a fasting state as well as APO B as previously recommended.  Further medication adjustments based on that.  Given her family history of heart disease in her father, recommend calcium scoring to further assess her cardiovascular risk.  Follow-up in 3 to 4 months  Pixie Casino, MD, FACC, [place] Director of the Advanced Lipid Disorders &  Cardiovascular Risk Reduction Clinic Diplomate of the American Board of Clinical Lipidology Attending Cardiologist  Direct Dial: [phone]  Fax: [phone]  Website:  [email or web address] 05/20/2020, 11:36 AM

## 2020-05-27 DIAGNOSIS — E782 Mixed hyperlipidemia: Secondary | ICD-10-CM | POA: Diagnosis not present

## 2020-05-28 LAB — NMR, LIPOPROFILE
Cholesterol, Total: 223 mg/dL — ABNORMAL HIGH (ref 100–199)
HDL Particle Number: 40.2 umol/L (ref >=30.5)
HDL-C: 40 mg/dL (ref >39)
LDL Particle Number: 562 nmol/L (ref <1000)
LDL Size: 19.6 nm — ABNORMAL LOW (ref >20.5)
LDL-C (NIH Calc): 69 mg/dL (ref 0–99)
LP-IR Score: 50 — ABNORMAL HIGH (ref <=45)
Small LDL Particle Number: 486 nmol/L (ref <=527)
Triglycerides: 741 mg/dL (ref 0–149)

## 2020-05-28 LAB — LIPID PANEL
Chol/HDL Ratio: 7.6 ratio — ABNORMAL HIGH (ref 0.0–4.4)
Cholesterol, Total: 228 mg/dL — ABNORMAL HIGH (ref 100–199)
HDL: 30 mg/dL — ABNORMAL LOW (ref >39)
LDL Chol Calc (NIH): 84 mg/dL (ref 0–99)
Triglycerides: 700 mg/dL (ref 0–149)
VLDL Cholesterol Cal: 114 mg/dL — ABNORMAL HIGH (ref 5–40)

## 2020-05-28 LAB — APOLIPOPROTEIN B: Apolipoprotein B: 129 mg/dL — ABNORMAL HIGH (ref <90)

## 2020-06-21 ENCOUNTER — Ambulatory Visit (INDEPENDENT_AMBULATORY_CARE_PROVIDER_SITE_OTHER)
Admission: RE | Admit: 2020-06-21 | Discharge: 2020-06-21 | Disposition: A | Discharge status: Home / Self Care | Payer: Self-pay | Source: Ambulatory Visit | Attending: Internal Medicine | Admitting: Internal Medicine

## 2020-06-21 ENCOUNTER — Other Ambulatory Visit: Payer: Self-pay

## 2020-06-21 DIAGNOSIS — E783 Hyperchylomicronemia: Secondary | ICD-10-CM

## 2020-07-01 ENCOUNTER — Telehealth: Payer: Self-pay | Admitting: Internal Medicine

## 2020-07-01 ENCOUNTER — Other Ambulatory Visit: Payer: Self-pay | Admitting: *Deleted

## 2020-07-01 DIAGNOSIS — E783 Hyperchylomicronemia: Secondary | ICD-10-CM

## 2020-07-01 DIAGNOSIS — Z8249 Family history of ischemic heart disease and other diseases of the circulatory system: Secondary | ICD-10-CM

## 2020-07-01 MED ORDER — EZETIMIBE 10 MG PO TABS: 10.0000 mg | ORAL_TABLET | Freq: Every day | ORAL | 3 refills | Status: DC

## 2020-07-01 NOTE — Telephone Encounter (Signed)
Spoke with patient about results. Aware to start zetia in addition to current meds. Lab reminder will be sent closer to appt time.

## 2020-07-01 NOTE — Telephone Encounter (Signed)
Patient returning call for CT results. She states she was not able to get on mychart to see them.

## 2020-07-01 NOTE — Telephone Encounter (Signed)
Patient called w/results  Julia Casino, MD  06/21/2020  4:04 PM EDT      CAC score of 28.7, 56th percentile - aortic atherosclerosis noted.   Dr Lemmie Evens

## 2020-07-30 DIAGNOSIS — E890 Postprocedural hypothyroidism: Secondary | ICD-10-CM | POA: Diagnosis not present

## 2020-08-26 ENCOUNTER — Other Ambulatory Visit: Payer: Self-pay | Admitting: *Deleted

## 2020-08-26 DIAGNOSIS — E783 Hyperchylomicronemia: Secondary | ICD-10-CM

## 2020-08-26 DIAGNOSIS — Z8249 Family history of ischemic heart disease and other diseases of the circulatory system: Secondary | ICD-10-CM

## 2020-09-25 DIAGNOSIS — Z8249 Family history of ischemic heart disease and other diseases of the circulatory system: Secondary | ICD-10-CM | POA: Diagnosis not present

## 2020-09-25 DIAGNOSIS — E783 Hyperchylomicronemia: Secondary | ICD-10-CM | POA: Diagnosis not present

## 2020-09-26 LAB — NMR, LIPOPROFILE
Cholesterol, Total: 174 mg/dL (ref 100–199)
HDL Particle Number: 42.7 umol/L (ref >=30.5)
HDL-C: 42 mg/dL (ref >39)
LDL Particle Number: 910 nmol/L (ref <1000)
LDL Size: 19.7 nm — ABNORMAL LOW (ref >20.5)
LDL-C (NIH Calc): 73 mg/dL (ref 0–99)
LP-IR Score: 69 — ABNORMAL HIGH (ref <=45)
Small LDL Particle Number: 707 nmol/L — ABNORMAL HIGH (ref <=527)
Triglycerides: 373 mg/dL — ABNORMAL HIGH (ref 0–149)

## 2020-09-26 LAB — APOLIPOPROTEIN B: Apolipoprotein B: 92 mg/dL — ABNORMAL HIGH (ref <90)

## 2020-10-03 ENCOUNTER — Ambulatory Visit: Payer: Medicare Other | Admitting: Internal Medicine

## 2020-10-03 ENCOUNTER — Other Ambulatory Visit: Payer: Self-pay

## 2020-10-03 ENCOUNTER — Encounter: Payer: Self-pay | Admitting: Internal Medicine

## 2020-10-03 VITALS — BP 128/76 | HR 66 | Ht 62.0 in | Wt 148.0 lb

## 2020-10-03 DIAGNOSIS — R931 Abnormal findings on diagnostic imaging of heart and coronary circulation: Secondary | ICD-10-CM

## 2020-10-03 DIAGNOSIS — E039 Hypothyroidism, unspecified: Secondary | ICD-10-CM | POA: Diagnosis not present

## 2020-10-03 DIAGNOSIS — E783 Hyperchylomicronemia: Secondary | ICD-10-CM

## 2020-10-03 NOTE — Progress Notes (Signed)
LIPID CLINIC CONSULT NOTE  Chief Complaint:  Follow-up high triglycerides  Primary Care Physician: Mayra Neer, MD  Primary Cardiologist:  None  HPI:  Julia Cole is a 70 y.o. female who is being seen today for the evaluation of high triglycerides at the request of Mayra Neer, MD.  This is a pleasant 70 year old female kindly referred by Dr. Brigitte Pulse for evaluation and management of high triglycerides.  This is apparently a longstanding problem and she has been on different medications for in the past.  Particularly she has been on simvastatin and more recently on fenofibrate.  Her last lipids were assessed in October showing total cholesterol 219, HDL 40, LDL 53 and triglycerides 897.  She reports triglycerides over thousand in the past.  She says that her father had heart disease and died of an MI in his 65s but was a heavy smoker.  He also had high cholesterol.  She is physically active still participating in spin classes but reports she gets short of breath and fatigue much easier.  She also has a history of thyroid disorder with partial thyroidectomy on thyroid replacement followed by Dr. Chalmers Cater.  She denies tobacco or alcohol use.  She reports a varied diet but does not necessarily restrict saturated fats to low levels.  She denies history of pancreatitis.  05/20/2020  Julia Cole returns today for follow-up.  Unfortunately she says she was never contacted about Lovaza.  She said she tried to contact the pharmacy but never got a call back.  Subsequently she decided to start taking over-the-counter fish oil on her own.  She remains on fenofibrate and simvastatin.  She was also supposed to have lipids prior to follow-up but she did not have those performed as well.  She is not fasting today.  10/03/2020  Julia Cole is seen today in follow-up.  She has had further improvement in her triglycerides after adding ezetimibe.  She had a calcium score which was mildly elevated  28.7, 56 percentile for age and sex matched controls.  High repeat lipids in July showed total cholesterol of 174, HDL 42, triglycerides 373 and LDL 73.  Triglycerides have come down from 700 in the past.  Her small LDL particles have increased however from 486 - 707 and there is been an increase in her LDL particle numbers from 562 to 910.  She reports her physical activity has declined somewhat.   PMHx:  Past Medical History:  Diagnosis Date   CMT (Charcot-Marie-Tooth disease)    High triglycerides    Hypertension    Hypothyroidism    Thyroid cancer ([place])     No past surgical history on file.  FAMHx:  Family History  Problem Relation Age of Onset   Heart attack Mother     SOCHx:   reports that she has never smoked. She has never used smokeless tobacco. No history on file for alcohol use and drug use.  ALLERGIES:  No Known Allergies  ROS: Pertinent items noted in HPI and remainder of comprehensive ROS otherwise negative.  HOME MEDS: Current Outpatient Medications on File Prior to Visit  Medication Sig Dispense Refill   ASPIRIN 81 PO 1 tablet     cholecalciferol (VITAMIN D3) 25 MCG (1000 UNIT) tablet 1 tablet     fenofibrate 160 MG tablet      levothyroxine (SYNTHROID) 88 MCG tablet 1 tablet in the morning on an empty stomach     linaclotide (LINZESS) 72 MCG capsule prn  metroNIDAZOLE (FLAGYL) 250 MG tablet Take 250 mg by mouth 3 (three) times daily.     omega-3 acid ethyl esters (LOVAZA) 1 g capsule Take 2 capsules (2 g total) by mouth 2 (two) times daily. 360 capsule 3   ramipril (ALTACE) 10 MG capsule 1 capsule     simvastatin (ZOCOR) 40 MG tablet Take 40 mg by mouth at bedtime.     triamterene-hydrochlorothiazide (MAXZIDE-25) 37.5-25 MG tablet 1 tablet in the morning     ezetimibe (ZETIA) 10 MG tablet Take 1 tablet (10 mg total) by mouth daily. 90 tablet 3   No current facility-administered medications on file prior to visit.    LABS/IMAGING: No results found  for this or any previous visit (from the past 48 hour(s)). No results found.  LIPID PANEL:    Component Value Date/Time   CHOL 228 (H) 05/27/2020 0905   TRIG 700 (HH) 05/27/2020 0905   HDL 30 (L) 05/27/2020 0905   CHOLHDL 7.6 (H) 05/27/2020 0905   LDLCALC 84 05/27/2020 0905    WEIGHTS: Wt Readings from Last 3 Encounters:  10/03/20 148 lb (67.1 kg)  05/20/20 148 lb (67.1 kg)  02/16/20 147 lb 6.4 oz (66.9 kg)    VITALS: BP 128/76   Pulse 66   Ht '5\' 2"'$  (1.575 m)   Wt 148 lb (67.1 kg)   SpO2 98%   BMI 27.07 kg/m   EXAM: Deferred  EKG: Deferred  ASSESSMENT: Hyperchylomicronemia Hypothyroidism with history of thyroid CA and prior partial thyroidectomy Family history of premature CAD in her father  PLAN: 1.   Julia Cole has had 50% reduction in her triglycerides however her LDL particle numbers and LDL-C have increased.  Particularly driven by small LDL P.  I still think her current medication regimen is adequate however this may be related to dietary changes or decrease in exercise.  She will continue to work on that.  She has been taking up to 2 g of over-the-counter fish oil which may increase her saturated fats and could be driving small LDL P.  Also may be related to the fact that her fraction of chylomicrons and large particles have declined and therefore now are seeing more smaller LDL particles.  The LDL particle size has not changed but the particle number has.  Follow-up in 6 months.  Pixie Casino, MD, [facility], [place] Director of the Advanced Lipid Disorders &  Cardiovascular Risk Reduction Clinic Diplomate of the American Board of Clinical Lipidology Attending Cardiologist  Direct Dial: [phone]  Fax: [phone]  Website:  [email or web address] 10/03/2020, 8:44 AM

## 2020-10-03 NOTE — Patient Instructions (Signed)
Medication Instructions:  Your physician recommends that you continue on your current medications as directed. Please refer to the Current Medication list given to you today.  *If you need a refill on your cardiac medications before your next appointment, please call your pharmacy*   Lab Work: FASTING lab work in about 6 months to check cholesterol  -- please complete 1 week before your next appointment  If you have labs (blood work) drawn today and your tests are completely normal, you will receive your results only by: [facility] (if you have MyChart) OR A paper copy in the mail If you have any lab test that is abnormal or we need to change your treatment, we will call you to review the results.   Testing/Procedures: NONE   Follow-Up: At [facility], you and your health needs are our priority.  As part of our continuing mission to provide you with exceptional heart care, we have created designated Provider Care Teams.  These Care Teams include your primary Cardiologist (physician) and Advanced Practice Providers (APPs -  Physician Assistants and Nurse Practitioners) who all work together to provide you with the care you need, when you need it.  We recommend signing up for the patient portal called "MyChart".  Sign up information is provided on this After Visit Summary.  MyChart is used to connect with patients for Virtual Visits (Telemedicine).  Patients are able to view lab/test results, encounter notes, upcoming appointments, etc.  Non-urgent messages can be sent to your provider as well.   To learn more about what you can do with MyChart, go to [email or web address].    Your next appointment:   6 month(s) - lipid clinic  The format for your next appointment:   In Person  Provider:   K. Mali Hilty, MD   Other Instructions

## 2020-10-31 DIAGNOSIS — Z1231 Encounter for screening mammogram for malignant neoplasm of breast: Secondary | ICD-10-CM | POA: Diagnosis not present

## 2020-11-06 DIAGNOSIS — E039 Hypothyroidism, unspecified: Secondary | ICD-10-CM | POA: Diagnosis not present

## 2020-11-06 DIAGNOSIS — R7309 Other abnormal glucose: Secondary | ICD-10-CM | POA: Diagnosis not present

## 2020-11-06 DIAGNOSIS — E782 Mixed hyperlipidemia: Secondary | ICD-10-CM | POA: Diagnosis not present

## 2020-11-06 DIAGNOSIS — I10 Essential (primary) hypertension: Secondary | ICD-10-CM | POA: Diagnosis not present

## 2020-11-15 DIAGNOSIS — C73 Malignant neoplasm of thyroid gland: Secondary | ICD-10-CM | POA: Diagnosis not present

## 2020-11-15 DIAGNOSIS — E890 Postprocedural hypothyroidism: Secondary | ICD-10-CM | POA: Diagnosis not present

## 2020-11-15 DIAGNOSIS — I10 Essential (primary) hypertension: Secondary | ICD-10-CM | POA: Diagnosis not present

## 2020-11-22 DIAGNOSIS — C73 Malignant neoplasm of thyroid gland: Secondary | ICD-10-CM | POA: Diagnosis not present

## 2020-11-22 DIAGNOSIS — E890 Postprocedural hypothyroidism: Secondary | ICD-10-CM | POA: Diagnosis not present

## 2020-11-22 DIAGNOSIS — I10 Essential (primary) hypertension: Secondary | ICD-10-CM | POA: Diagnosis not present

## 2021-01-14 DIAGNOSIS — J069 Acute upper respiratory infection, unspecified: Secondary | ICD-10-CM | POA: Diagnosis not present

## 2021-01-14 DIAGNOSIS — N3000 Acute cystitis without hematuria: Secondary | ICD-10-CM | POA: Diagnosis not present

## 2021-03-21 ENCOUNTER — Other Ambulatory Visit: Payer: Medicare Other

## 2021-03-21 DIAGNOSIS — E783 Hyperchylomicronemia: Secondary | ICD-10-CM | POA: Diagnosis not present

## 2021-03-24 ENCOUNTER — Encounter: Payer: Self-pay | Admitting: Internal Medicine

## 2021-03-24 ENCOUNTER — Ambulatory Visit: Payer: Medicare Other | Admitting: Internal Medicine

## 2021-03-24 ENCOUNTER — Other Ambulatory Visit: Payer: Self-pay

## 2021-03-24 VITALS — BP 122/71 | HR 78 | Ht 61.0 in | Wt 140.6 lb

## 2021-03-24 DIAGNOSIS — E783 Hyperchylomicronemia: Secondary | ICD-10-CM | POA: Diagnosis not present

## 2021-03-24 DIAGNOSIS — Z8249 Family history of ischemic heart disease and other diseases of the circulatory system: Secondary | ICD-10-CM

## 2021-03-24 DIAGNOSIS — R931 Abnormal findings on diagnostic imaging of heart and coronary circulation: Secondary | ICD-10-CM

## 2021-03-24 MED ORDER — EZETIMIBE 10 MG PO TABS: 10.0000 mg | ORAL_TABLET | Freq: Every day | ORAL | 3 refills | Status: DC

## 2021-03-24 NOTE — Progress Notes (Signed)
LIPID CLINIC CONSULT NOTE  Chief Complaint:  Follow-up high triglycerides  Primary Care Physician: Mayra Neer, MD  Primary Cardiologist:  None  HPI:  Julia Cole is a 71 y.o. female who is being seen today for the evaluation of high triglycerides at the request of Mayra Neer, MD.  This is a pleasant 71 year old female kindly referred by Dr. Brigitte Pulse for evaluation and management of high triglycerides.  This is apparently a longstanding problem and she has been on different medications for in the past.  Particularly she has been on simvastatin and more recently on fenofibrate.  Her last lipids were assessed in October showing total cholesterol 219, HDL 40, LDL 53 and triglycerides 897.  She reports triglycerides over thousand in the past.  She says that her father had heart disease and died of an MI in his 70s but was a heavy smoker.  He also had high cholesterol.  She is physically active still participating in spin classes but reports she gets short of breath and fatigue much easier.  She also has a history of thyroid disorder with partial thyroidectomy on thyroid replacement followed by Dr. Chalmers Cater.  She denies tobacco or alcohol use.  She reports a varied diet but does not necessarily restrict saturated fats to low levels.  She denies history of pancreatitis.  05/20/2020  Julia Cole returns today for follow-up.  Unfortunately she says she was never contacted about Lovaza.  She said she tried to contact the pharmacy but never got a call back.  Subsequently she decided to start taking over-the-counter fish oil on her own.  She remains on fenofibrate and simvastatin.  She was also supposed to have lipids prior to follow-up but she did not have those performed as well.  She is not fasting today.  10/03/2020  Julia Cole is seen today in follow-up.  She has had further improvement in her triglycerides after adding ezetimibe.  She had a calcium score which was mildly elevated  28.7, 56 percentile for age and sex matched controls.  High repeat lipids in July showed total cholesterol of 174, HDL 42, triglycerides 373 and LDL 73.  Triglycerides have come down from 700 in the past.  Her small LDL particles have increased however from 486 - 707 and there is been an increase in her LDL particle numbers from 562 to 910.  She reports her physical activity has declined somewhat.  03/24/2021  Julia Cole returns today for follow-up.  She had repeat lipids this past Friday however they have not resulted yet.  We did discuss her simvastatin, however because she says she has been having left leg pain that radiates down from her hip to her foot.  It is associated with some cramping that occurs intermittently but was notably worse when taking the simvastatin but improved when stopping it.  She has been on this medicine for some time.  I suspect there is interaction between that, her ezetimibe and fenofibrate however her cholesterol has been well controlled.  Previously she thinks she had taken atorvastatin but it is not listed as an intolerance  PMHx:  Past Medical History:  Diagnosis Date   CMT (Charcot-Marie-Tooth disease)    High triglycerides    Hypertension    Hypothyroidism    Thyroid cancer ([place])     No past surgical history on file.  FAMHx:  Family History  Problem Relation Age of Onset   Heart attack Mother     SOCHx:   reports that she has  never smoked. She has never used smokeless tobacco. No history on file for alcohol use and drug use.  ALLERGIES:  No Known Allergies  ROS: Pertinent items noted in HPI and remainder of comprehensive ROS otherwise negative.  HOME MEDS: Current Outpatient Medications on File Prior to Visit  Medication Sig Dispense Refill   ASPIRIN 81 PO 1 tablet     cholecalciferol (VITAMIN D3) 25 MCG (1000 UNIT) tablet 1 tablet     fenofibrate 160 MG tablet      levothyroxine (SYNTHROID) 88 MCG tablet 1 tablet in the morning on an empty  stomach     metroNIDAZOLE (FLAGYL) 250 MG tablet Take 250 mg by mouth 3 (three) times daily.     omega-3 acid ethyl esters (LOVAZA) 1 g capsule Take 2 capsules (2 g total) by mouth 2 (two) times daily. 360 capsule 3   ramipril (ALTACE) 10 MG capsule 1 capsule     simvastatin (ZOCOR) 40 MG tablet Take 40 mg by mouth at bedtime.     triamterene-hydrochlorothiazide (MAXZIDE-25) 37.5-25 MG tablet 1 tablet in the morning     ezetimibe (ZETIA) 10 MG tablet Take 1 tablet (10 mg total) by mouth daily. 90 tablet 3   No current facility-administered medications on file prior to visit.    LABS/IMAGING: No results found for this or any previous visit (from the past 48 hour(s)). No results found.  LIPID PANEL:    Component Value Date/Time   CHOL 228 (H) 05/27/2020 0905   TRIG 700 (HH) 05/27/2020 0905   HDL 30 (L) 05/27/2020 0905   CHOLHDL 7.6 (H) 05/27/2020 0905   LDLCALC 84 05/27/2020 0905    WEIGHTS: Wt Readings from Last 3 Encounters:  03/24/21 140 lb 9.6 oz (63.8 kg)  10/03/20 148 lb (67.1 kg)  05/20/20 148 lb (67.1 kg)    VITALS: BP 122/71    Pulse 78    Ht 5\' 1"  (1.549 m)    Wt 140 lb 9.6 oz (63.8 kg)    SpO2 99%    BMI 26.57 kg/m   EXAM: Deferred  EKG: Deferred  ASSESSMENT: Hyperchylomicronemia Hypothyroidism with history of thyroid CA and prior partial thyroidectomy Family history of premature CAD in her father  PLAN: 1.   Ms. Cole just had a recent lipid profile on Friday.  That has not yet resulted.  I will review it however it sounds like she is having side effects on simvastatin.  This is not surprising due to multiple interactions as well that are possible.  I would like to consider rosuvastatin which is less likely to interact and we could use at a lower dose.  Based on her cholesterol numbers will tailor her rosuvastatin dose and repeat her lipids in about 3 to 4 months.  Follow-up at that time.  Pixie Casino, MD, [facility], [place] Director of the Advanced Lipid Disorders &  Cardiovascular Risk Reduction Clinic Diplomate of the American Board of Clinical Lipidology Attending Cardiologist  Direct Dial: [phone]   Fax: [phone]  Website:  [email or web address] 03/24/2021, 8:59 AM

## 2021-03-24 NOTE — Patient Instructions (Addendum)
Medication Instructions:  Your physician recommends that you continue on your current medications as directed. Please refer to the Current Medication list given to you today.  *If you need a refill on your cardiac medications before your next appointment, please call your pharmacy*   Follow-Up: At [facility], you and your health needs are our priority.  As part of our continuing mission to provide you with exceptional heart care, we have created designated Provider Care Teams.  These Care Teams include your primary Cardiologist (physician) and Advanced Practice Providers (APPs -  Physician Assistants and Nurse Practitioners) who all work together to provide you with the care you need, when you need it.  We recommend signing up for the patient portal called "MyChart".  Sign up information is provided on this After Visit Summary.  MyChart is used to connect with patients for Virtual Visits (Telemedicine).  Patients are able to view lab/test results, encounter notes, upcoming appointments, etc.  Non-urgent messages can be sent to your provider as well.   To learn more about what you can do with MyChart, go to [email or web address].    Your next appointment:   3-4 month(s) - lipid clinic  The format for your next appointment:   In Person  Provider:   Lyman Bishop MD

## 2021-03-25 LAB — NMR, LIPOPROFILE
Cholesterol, Total: 252 mg/dL — ABNORMAL HIGH (ref 100–199)
HDL Particle Number: 38.4 umol/L (ref >=30.5)
HDL-C: 28 mg/dL — ABNORMAL LOW (ref >39)
LDL Particle Number: 751 nmol/L (ref <1000)
LDL Size: 19.6 nm — ABNORMAL LOW (ref >20.5)
LP-IR Score: 85 — ABNORMAL HIGH (ref <=45)
Small LDL Particle Number: 492 nmol/L (ref <=527)
Triglycerides: 1075 mg/dL (ref 0–149)

## 2021-04-02 ENCOUNTER — Other Ambulatory Visit: Payer: Self-pay | Admitting: *Deleted

## 2021-04-02 DIAGNOSIS — E783 Hyperchylomicronemia: Secondary | ICD-10-CM

## 2021-04-02 DIAGNOSIS — R931 Abnormal findings on diagnostic imaging of heart and coronary circulation: Secondary | ICD-10-CM

## 2021-04-02 MED ORDER — ROSUVASTATIN CALCIUM 20 MG PO TABS: 20.0000 mg | ORAL_TABLET | Freq: Every day | ORAL | 3 refills | Status: DC

## 2021-04-04 ENCOUNTER — Encounter: Payer: Self-pay | Admitting: *Deleted

## 2021-04-04 NOTE — Research (Unsigned)
Message left on Ms Oakland with my contact info and information about Core research study.  Also sent a mychart message Encouraged her to call me back.

## 2021-04-07 ENCOUNTER — Encounter: Payer: Self-pay | Admitting: *Deleted

## 2021-04-07 DIAGNOSIS — Z006 Encounter for examination for normal comparison and control in clinical research program: Secondary | ICD-10-CM

## 2021-04-07 NOTE — Research (Unsigned)
Spoke with Julia Cole about CORE research. She states she would like more information about CORE. Email sent with my contact information and information about CORE.

## 2021-04-09 ENCOUNTER — Encounter: Payer: Self-pay | Admitting: *Deleted

## 2021-04-09 DIAGNOSIS — Z006 Encounter for examination for normal comparison and control in clinical research program: Secondary | ICD-10-CM

## 2021-04-09 NOTE — Research (Signed)
Spoke with Ms Brossard about the consent and information I sent her. States that she accidentally deleted the e-mail. I emailed her the information along with my contact information.

## 2021-04-14 ENCOUNTER — Encounter: Payer: Self-pay | Admitting: *Deleted

## 2021-04-14 NOTE — Research (Unsigned)
Spoke with Julia Cole states she is interested in coming in for [employer]. Scheduled appointment for Feb 23 at 0830.

## 2021-04-22 DIAGNOSIS — Z86010 Personal history of colonic polyps: Secondary | ICD-10-CM | POA: Diagnosis not present

## 2021-04-30 ENCOUNTER — Encounter: Payer: Self-pay | Admitting: *Deleted

## 2021-04-30 DIAGNOSIS — Z006 Encounter for examination for normal comparison and control in clinical research program: Secondary | ICD-10-CM

## 2021-04-30 NOTE — Research (Signed)
Julia Cole called to remind her of appointment tomorrow at 0830 here in Research.

## 2021-05-01 ENCOUNTER — Other Ambulatory Visit: Payer: Self-pay

## 2021-05-01 ENCOUNTER — Encounter: Payer: Medicare Other | Admitting: *Deleted

## 2021-05-01 DIAGNOSIS — Z006 Encounter for examination for normal comparison and control in clinical research program: Secondary | ICD-10-CM

## 2021-05-01 NOTE — Progress Notes (Signed)
Patient seen for screening for CORE.  Patient is followed closely by her primary care provider and has seen Dr. Debara Pickett for evaluation of elevated triglycerides.  No chest pain.  No prior history of pancreatitis.  Is aware of dietary issues and is compliant with those recommendations.    Study, purpose, known data, and process of FDA approval reviewed in detail with patient.  I also reviewed drug mechanisms, conduct and randomizations in clinical trials, expected potential benefits and risks.  See note detailing inclusion and exclusions.  Patient followed by Dr. Debara Pickett.   VSS as recorded in coordinator note.  No JVD.  Lungs clear to AP.  Cardiac rhythm regular. No murmurs.  Abd soft. No ext edema.  Neurologic non focal.  [place] Class I.   EKG - NRS.  Non specific ST abnormality.  No evidence of infarction.   Medications reviewed including statin therapy by Dr. Debara Pickett.   Imp:  Chylomicronemia  Patient will begin screening process per protocol.   Loretha Brasil. Lia Foyer, MD, [facility], [place] Director, [facility]

## 2021-05-01 NOTE — Research (Cosign Needed Addendum)
Screening Run-In Clinic Visit    Date of Visit: 01-May-2021   Subject #: [id number]     During this visit the following activities were completed:  _0 Reading, Signing and Understanding the informed Consent   _1 Review Inclusion/Exclusion Criteria  _2 Vital Signs, Height, & Weight:  - Blood pressure: (Subject sat supine for at least 5 minutes before blood pressure was performed)108/57 - Heart rate:78 - Temperature:98.4 - Respiratory Rate:18 - Oxygen Saturation:99% - Weight:138.6 lbs - Height:47f1in  _3 Physical Exam done by PI or Sub-I  _4 Review Subjects Medical History & Concomitant Medications  _5 Review Any Adverse Events/ Serious Adverse Events  _6 Review of any ER Visits, Hospitalizations and Inpatient Days  _7 12-Lead ECG (Subject sat supine for at least 5 minutes before this was performed)  *All ECG's completed will be available in subjects binder   _8  Subject fasting   _9  Blood and Urine specimens collected per protocol   _10 Genetic Testing Completed (Only for patients with suspected FCS)  _11 Extended Urinalysis/Pregnancy Test (if woman of childbearing age)  _12 Diet/Lifestyle/Alcohol Counseling with Subject  _13 FCS Symptoms 2 Week Recall  _14 Education/teaching subject on importance of completing the daily diary   _15 Education on the importance of complying with contraception precautions during study with subject agreement    Subject came into the research clinic today (May 01, 2021) for the Screening Run-In Visit for the CORE research study.  Subject signed the informed consent under protocol amendment 2, Version 23 18-Apr-2020, before any assessments were completed.       Subject Name: Julia Cole Subject met inclusion and exclusion criteria.  The informed consent form, study requirements and expectations were reviewed with the subject and questions and concerns were addressed prior to the signing of the consent form.  The subject  verbalized understanding of the trial requirements.  The subject agreed to participate in the Core  trial and signed the informed consent at 0828 on 01-May-2021.  The informed consent was obtained prior to performance of any protocol-specific procedures for the subject.  A copy of the signed informed consent was given to the subject and a copy was placed in the subject's medical record.   Julia Cole  Protocol number 1.2 Consent version  2    Julia Cole here today for Run in visit. Reports no abdominal pain, or pain else where. Physical exam completed by Dr.Stuckey.Urine obtained at 0922, Blood obtained at 0928.Scheduled next appointment for March 9 at 0830. Medications reviewed Reports she only takes Aspirin when she can remember. Also takes Aligin probiotic and Simvastatin 448mdaily.  Current Outpatient Medications:    ASPIRIN 81 PO, 1 tablet, Disp: , Rfl:    cholecalciferol (VITAMIN D3) 25 MCG (1000 UNIT) tablet, 1 tablet, Disp: , Rfl:    ezetimibe (ZETIA) 10 MG tablet, Take 1 tablet (10 mg total) by mouth daily., Disp: 90 tablet, Rfl: 3   fenofibrate 160 MG tablet, , Disp: , Rfl:    levothyroxine (SYNTHROID) 88 MCG tablet, 1 tablet in the morning on an empty stomach, Disp: , Rfl:    metroNIDAZOLE (FLAGYL) 250 MG tablet, Take 250 mg by mouth 3 (three) times daily., Disp: , Rfl:    omega-3 acid ethyl esters (LOVAZA) 1 g capsule, Take 2 capsules (2 g total) by mouth 2 (two) times daily., Disp: 360 capsule, Rfl: 3   ramipril (ALTACE) 10 MG capsule, 1 capsule, Disp: , Rfl:    rosuvastatin (CRESTOR) 20 MG tablet, Take 1 tablet (20 mg total) by mouth daily.,  Disp: 90 tablet, Rfl: 3   triamterene-hydrochlorothiazide (MAXZIDE-25) 37.5-25 MG tablet, 1 tablet in the morning, Disp: , Rfl:

## 2021-05-05 ENCOUNTER — Encounter: Payer: Self-pay | Admitting: *Deleted

## 2021-05-05 DIAGNOSIS — Z006 Encounter for examination for normal comparison and control in clinical research program: Secondary | ICD-10-CM

## 2021-05-05 NOTE — Research (Addendum)
Run in Visit for [place] Abnormal Lab report 23-[id number] [facility]  Chemistry: Magnesium 1.6 mg/dL                     [] Clinically Significant  [x] Not Clinically Significant hs-C- Reactive Protein  26.4 mg/L  [] Clinically Significant  [x] Not Clinically Significant  Serology Free T4     1.96 ng/dL                      [] Clinically Significant  [x] Not Clinically Significant   Lipids:  Triglyceride  249  mg/dL                   [] Clinically Significant  [x] Not Clinically Significant   Any further action needed to be taken per the PI?   Magnesium is noted to be low - would advise starting magnesium oxide 200 mg BID.  Will notify my nurse to order this.  Pixie Casino, MD, [facility], [place] Director of the Advanced Lipid Disorders &  Cardiovascular Risk Reduction Clinic Diplomate of the American Board of Clinical Lipidology Attending Cardiologist  Direct Dial: [phone]   Fax: [phone]  Website:  [email or web address]

## 2021-05-05 NOTE — Research (Signed)
Spoke with Julia Cole about Core research. Informed her that triglycerides were 249 so unable to continue in the Core research study. Voices understanding.

## 2021-05-06 ENCOUNTER — Encounter: Payer: Self-pay | Admitting: *Deleted

## 2021-05-06 DIAGNOSIS — G600 Hereditary motor and sensory neuropathy: Secondary | ICD-10-CM | POA: Insufficient documentation

## 2021-05-07 NOTE — Telephone Encounter (Signed)
Spoke with patient about addition of magnesium oxide 200mg  BID to regimen. She verbalized understanding.  ?

## 2021-05-12 DIAGNOSIS — M810 Age-related osteoporosis without current pathological fracture: Secondary | ICD-10-CM | POA: Diagnosis not present

## 2021-05-12 DIAGNOSIS — E039 Hypothyroidism, unspecified: Secondary | ICD-10-CM | POA: Diagnosis not present

## 2021-05-12 DIAGNOSIS — R7309 Other abnormal glucose: Secondary | ICD-10-CM | POA: Diagnosis not present

## 2021-05-12 DIAGNOSIS — Z0000 Encounter for general adult medical examination without abnormal findings: Secondary | ICD-10-CM | POA: Diagnosis not present

## 2021-05-12 DIAGNOSIS — J301 Allergic rhinitis due to pollen: Secondary | ICD-10-CM | POA: Diagnosis not present

## 2021-05-12 DIAGNOSIS — Z85850 Personal history of malignant neoplasm of thyroid: Secondary | ICD-10-CM | POA: Diagnosis not present

## 2021-05-12 DIAGNOSIS — E782 Mixed hyperlipidemia: Secondary | ICD-10-CM | POA: Diagnosis not present

## 2021-05-12 DIAGNOSIS — I10 Essential (primary) hypertension: Secondary | ICD-10-CM | POA: Diagnosis not present

## 2021-05-12 DIAGNOSIS — J019 Acute sinusitis, unspecified: Secondary | ICD-10-CM | POA: Diagnosis not present

## 2021-05-16 ENCOUNTER — Encounter: Payer: Self-pay | Admitting: *Deleted

## 2021-05-16 NOTE — Research (Addendum)
Medication review Ms Julia Cole states that she takes Aspirin to decrease risk of heart attack and stroke started 02-16-20  She takes Vitamin D for bone health started 12-17-19 Zetia for high cholesterol started 07-01-20 Fenofibrate for high cholesterol started 12-19-19 Synthroid for hypothyroidism started 01-18-18 Lovaza for high cholesterol started 02-16-20 Altace for HTN started 02-16-20 Crestor for high cholesterol started 03-24-21 Maxzide for HTN started 02-16-20 Flagyl for digestive problems started 02-16-20 Aligin probiotic for intestinal health started 2022 Crestor for high cholesterol started 02-16-20 Mag Oxide for low mag level started 05-01-21

## 2021-05-29 DIAGNOSIS — R350 Frequency of micturition: Secondary | ICD-10-CM | POA: Diagnosis not present

## 2021-07-02 DIAGNOSIS — E783 Hyperchylomicronemia: Secondary | ICD-10-CM | POA: Diagnosis not present

## 2021-07-03 LAB — NMR, LIPOPROFILE
Cholesterol, Total: 174 mg/dL (ref 100–199)
HDL Particle Number: 45.2 umol/L
HDL-C: 52 mg/dL
LDL Particle Number: 1257 nmol/L — ABNORMAL HIGH
LDL Size: 19.7 nm — ABNORMAL LOW
LDL-C (NIH Calc): 91 mg/dL (ref 0–99)
LP-IR Score: 74 — ABNORMAL HIGH
Small LDL Particle Number: 1054 nmol/L — ABNORMAL HIGH
Triglycerides: 184 mg/dL — ABNORMAL HIGH (ref 0–149)

## 2021-07-09 ENCOUNTER — Encounter: Payer: Self-pay | Admitting: Internal Medicine

## 2021-07-09 ENCOUNTER — Ambulatory Visit: Payer: Medicare Other | Admitting: Internal Medicine

## 2021-07-09 VITALS — BP 120/72 | HR 65 | Ht 61.05 in | Wt 138.0 lb

## 2021-07-09 DIAGNOSIS — R931 Abnormal findings on diagnostic imaging of heart and coronary circulation: Secondary | ICD-10-CM

## 2021-07-09 DIAGNOSIS — E783 Hyperchylomicronemia: Secondary | ICD-10-CM

## 2021-07-09 DIAGNOSIS — Z8249 Family history of ischemic heart disease and other diseases of the circulatory system: Secondary | ICD-10-CM

## 2021-07-09 NOTE — Patient Instructions (Signed)
Medication Instructions:  ?Your physician recommends that you continue on your current medications as directed. Please refer to the Current Medication list given to you today. ? ?*If you need a refill on your cardiac medications before your next appointment, please call your pharmacy* ? ? ?Lab Work: ?FASTING NMR lipoprofile to check cholesterol in about 6 months -- before next appointment  ? ?If you have labs (blood work) drawn today and your tests are completely normal, you will receive your results only by: ?MyChart Message (if you have MyChart) OR ?A paper copy in the mail ?If you have any lab test that is abnormal or we need to change your treatment, we will call you to review the results. ? ? ?Testing/Procedures: ?NONE ? ? ?Follow-Up: ?At [facility], you and your health needs are our priority.  As part of our continuing mission to provide you with exceptional heart care, we have created designated Provider Care Teams.  These Care Teams include your primary Cardiologist (physician) and Advanced Practice Providers (APPs -  Physician Assistants and Nurse Practitioners) who all work together to provide you with the care you need, when you need it. ? ?We recommend signing up for the patient portal called "MyChart".  Sign up information is provided on this After Visit Summary.  MyChart is used to connect with patients for Virtual Visits (Telemedicine).  Patients are able to view lab/test results, encounter notes, upcoming appointments, etc.  Non-urgent messages can be sent to your provider as well.   ?To learn more about what you can do with MyChart, go to [email or web address].   ? ?Your next appointment:   ?6 month(s) ? ?The format for your next appointment:   ?In Person ? ?Provider:   ?Lyman Bishop MD -- lipid clinic ? ?Important Information About Sugar ? ? ? ? ? ? ?

## 2021-07-09 NOTE — Progress Notes (Signed)
? ? ?LIPID CLINIC CONSULT NOTE ? ?Chief Complaint:  ?Follow-up high triglycerides ? ?Primary Care Physician: ?Mayra Neer, MD ? ?Primary Cardiologist:  ?None ? ?HPI:  ?Julia Cole is a 71 y.o. female who is being seen today for the evaluation of high triglycerides at the request of Mayra Neer, MD.  This is a pleasant 71 year old female kindly referred by Dr. Brigitte Pulse for evaluation and management of high triglycerides.  This is apparently a longstanding problem and she has been on different medications for in the past.  Particularly she has been on simvastatin and more recently on fenofibrate.  Her last lipids were assessed in October showing total cholesterol 219, HDL 40, LDL 53 and triglycerides 897.  She reports triglycerides over thousand in the past.  She says that her father had heart disease and died of an MI in his 21s but was a heavy smoker.  He also had high cholesterol.  She is physically active still participating in spin classes but reports she gets short of breath and fatigue much easier.  She also has a history of thyroid disorder with partial thyroidectomy on thyroid replacement followed by Dr. Chalmers Cater.  She denies tobacco or alcohol use.  She reports a varied diet but does not necessarily restrict saturated fats to low levels.  She denies history of pancreatitis. ? ?05/20/2020 ? ?Julia Cole returns today for follow-up.  Unfortunately she says she was never contacted about Lovaza.  She said she tried to contact the pharmacy but never got a call back.  Subsequently she decided to start taking over-the-counter fish oil on her own.  She remains on fenofibrate and simvastatin.  She was also supposed to have lipids prior to follow-up but she did not have those performed as well.  She is not fasting today. ? ?10/03/2020 ? ?Julia Cole is seen today in follow-up.  She has had further improvement in her triglycerides after adding ezetimibe.  She had a calcium score which was mildly elevated  28.7, 56 percentile for age and sex matched controls.  High repeat lipids in July showed total cholesterol of 174, HDL 42, triglycerides 373 and LDL 73.  Triglycerides have come down from 700 in the past.  Her small LDL particles have increased however from 486 - 707 and there is been an increase in her LDL particle numbers from 562 to 910.  She reports her physical activity has declined somewhat. ? ?03/24/2021 ? ?Julia Cole returns today for follow-up.  She had repeat lipids this past Friday however they have not resulted yet.  We did discuss her simvastatin, however because she says she has been having left leg pain that radiates down from her hip to her foot.  It is associated with some cramping that occurs intermittently but was notably worse when taking the simvastatin but improved when stopping it.  She has been on this medicine for some time.  I suspect there is interaction between that, her ezetimibe and fenofibrate however her cholesterol has been well controlled.  Previously she thinks she had taken atorvastatin but it is not listed as an intolerance ? ?07/09/2021 ? ?Julia Cole returns today for follow-up.  She was screened for the CORE triglyceride study.  Her triglycerides have historically been in the [phone] range however after adjusting her medications and switching her from simvastatin to rosuvastatin, her triglycerides are now markedly improved at 184.  Total cholesterol 174, HDL 52, LDL 91 and LDL particle #1257.  She seems to be tolerating this medication much better. ? ?  PMHx:  ?Past Medical History:  ?Diagnosis Date  ? CMT (Charcot-Marie-Tooth disease)   ? High triglycerides   ? Hypertension   ? Hypothyroidism   ? Thyroid cancer ([place])   ? ? ?No past surgical history on file. ? ?FAMHx:  ?Family History  ?Problem Relation Age of Onset  ? Heart attack Mother   ? ? ?SOCHx:  ? reports that she has never smoked. She has never used smokeless tobacco. No history on file for alcohol use and drug  use. ? ?ALLERGIES:  ?No Known Allergies ? ?ROS: ?Pertinent items noted in HPI and remainder of comprehensive ROS otherwise negative. ? ?HOME MEDS: ?Current Outpatient Medications on File Prior to Visit  ?Medication Sig Dispense Refill  ? ASPIRIN 81 PO 1 tablet    ? cholecalciferol (VITAMIN D3) 25 MCG (1000 UNIT) tablet 1 tablet    ? ezetimibe (ZETIA) 10 MG tablet Take 1 tablet (10 mg total) by mouth daily. 90 tablet 3  ? fenofibrate 160 MG tablet     ? levothyroxine (SYNTHROID) 88 MCG tablet 1 tablet in the morning on an empty stomach    ? Magnesium Oxide 200 MG TABS Take 1 tablet by mouth in the morning and at bedtime.    ? metroNIDAZOLE (FLAGYL) 250 MG tablet Take 250 mg by mouth 3 (three) times daily.    ? omega-3 acid ethyl esters (LOVAZA) 1 g capsule Take 2 capsules (2 g total) by mouth 2 (two) times daily. 360 capsule 3  ? ramipril (ALTACE) 10 MG capsule 1 capsule    ? rosuvastatin (CRESTOR) 20 MG tablet Take 1 tablet (20 mg total) by mouth daily. 90 tablet 3  ? triamterene-hydrochlorothiazide (MAXZIDE-25) 37.5-25 MG tablet 1 tablet in the morning    ? ?No current facility-administered medications on file prior to visit.  ? ? ?LABS/IMAGING: ?No results found for this or any previous visit (from the past 48 hour(s)). ?No results found. ? ?LIPID PANEL: ?   ?Component Value Date/Time  ? CHOL 228 (H) 05/27/2020 0905  ? TRIG 700 ([place]) 05/27/2020 0905  ? HDL 30 (L) 05/27/2020 0905  ? CHOLHDL 7.6 (H) 05/27/2020 0905  ? [place] 84 05/27/2020 0905  ? ? ?WEIGHTS: ?Wt Readings from Last 3 Encounters:  ?07/09/21 138 lb (62.6 kg)  ?03/24/21 140 lb 9.6 oz (63.8 kg)  ?10/03/20 148 lb (67.1 kg)  ? ? ?VITALS: ?BP 120/72   Pulse 65   Ht 5' 1.05" (1.551 m)   Wt 138 lb (62.6 kg)   SpO2 99%   BMI 26.03 kg/m?  ? ?EXAM: ?Deferred ? ?EKG: ?Deferred ? 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 ?PLAN: ?1.   Ms.  Cole has had significant interval improvement in her lipids on rosuvastatin in combination with Zetia, Lovaza and fenofibrate.  Triglycerides now 184 and LDL 91 with LDL particle #1257.  Overall pretty good treatment and marked improvement since her last assessment.  She did not qualify for the research trial however that is a good thing.  We will continue with this current therapy and plan to repeat lipid in 6 months and follow-up at that time. ? ?Pixie Casino, MD, [facility], FACP  ?Friend  ?Medical Director of the Advanced Lipid Disorders &  ?Cardiovascular Risk Reduction Clinic ?Diplomate of the [employer] of Clinical Lipidology ?Attending Cardiologist  ?Direct Dial: [phone]  Fax: [phone]  ?Website:  [email or web address] ? ?Chrissie Noa  C Reef Achterberg ?07/09/2021, 8:09 AM ?

## 2021-07-28 DIAGNOSIS — J019 Acute sinusitis, unspecified: Secondary | ICD-10-CM | POA: Diagnosis not present

## 2021-10-24 DIAGNOSIS — R300 Dysuria: Secondary | ICD-10-CM | POA: Diagnosis not present

## 2021-11-14 DIAGNOSIS — Z1231 Encounter for screening mammogram for malignant neoplasm of breast: Secondary | ICD-10-CM | POA: Diagnosis not present

## 2021-11-19 ENCOUNTER — Encounter: Payer: Self-pay | Admitting: *Deleted

## 2021-11-19 DIAGNOSIS — Z006 Encounter for examination for normal comparison and control in clinical research program: Secondary | ICD-10-CM

## 2021-11-19 NOTE — Research (Signed)
ICF updated and send to pt.

## 2021-12-02 DIAGNOSIS — E890 Postprocedural hypothyroidism: Secondary | ICD-10-CM | POA: Diagnosis not present

## 2021-12-02 DIAGNOSIS — C73 Malignant neoplasm of thyroid gland: Secondary | ICD-10-CM | POA: Diagnosis not present

## 2021-12-02 DIAGNOSIS — I10 Essential (primary) hypertension: Secondary | ICD-10-CM | POA: Diagnosis not present

## 2021-12-03 DIAGNOSIS — M810 Age-related osteoporosis without current pathological fracture: Secondary | ICD-10-CM | POA: Diagnosis not present

## 2021-12-03 DIAGNOSIS — Z780 Asymptomatic menopausal state: Secondary | ICD-10-CM | POA: Diagnosis not present

## 2021-12-03 DIAGNOSIS — M8589 Other specified disorders of bone density and structure, multiple sites: Secondary | ICD-10-CM | POA: Diagnosis not present

## 2021-12-09 DIAGNOSIS — E890 Postprocedural hypothyroidism: Secondary | ICD-10-CM | POA: Diagnosis not present

## 2021-12-09 DIAGNOSIS — C73 Malignant neoplasm of thyroid gland: Secondary | ICD-10-CM | POA: Diagnosis not present

## 2021-12-09 DIAGNOSIS — I10 Essential (primary) hypertension: Secondary | ICD-10-CM | POA: Diagnosis not present

## 2022-01-02 DIAGNOSIS — E783 Hyperchylomicronemia: Secondary | ICD-10-CM | POA: Diagnosis not present

## 2022-01-04 LAB — NMR, LIPOPROFILE
Cholesterol, Total: 168 mg/dL (ref 100–199)
HDL Particle Number: 41.7 umol/L (ref >=30.5)
HDL-C: 42 mg/dL (ref >39)
LDL Particle Number: 736 nmol/L (ref <1000)
LDL Size: 19.7 nm — ABNORMAL LOW (ref >20.5)
LDL-C (NIH Calc): 69 mg/dL (ref 0–99)
LP-IR Score: 72 — ABNORMAL HIGH (ref <=45)
Small LDL Particle Number: 584 nmol/L — ABNORMAL HIGH (ref <=527)
Triglycerides: 361 mg/dL — ABNORMAL HIGH (ref 0–149)

## 2022-01-09 ENCOUNTER — Encounter: Payer: Self-pay | Admitting: Internal Medicine

## 2022-01-09 ENCOUNTER — Ambulatory Visit: Payer: Medicare Other | Attending: Internal Medicine | Admitting: Internal Medicine

## 2022-01-09 VITALS — BP 122/80 | HR 78 | Ht 61.0 in | Wt 141.6 lb

## 2022-01-09 DIAGNOSIS — R931 Abnormal findings on diagnostic imaging of heart and coronary circulation: Secondary | ICD-10-CM | POA: Diagnosis not present

## 2022-01-09 DIAGNOSIS — Z8249 Family history of ischemic heart disease and other diseases of the circulatory system: Secondary | ICD-10-CM

## 2022-01-09 DIAGNOSIS — E783 Hyperchylomicronemia: Secondary | ICD-10-CM

## 2022-01-09 NOTE — Progress Notes (Signed)
LIPID CLINIC CONSULT NOTE  Chief Complaint:  Follow-up high triglycerides  Primary Care Physician: Mayra Neer, MD  Primary Cardiologist:  None  HPI:  Julia Cole is a 71 y.o. female who is being seen today for the evaluation of high triglycerides at the request of Mayra Neer, MD.  This is a pleasant 71 year old female kindly referred by Dr. Brigitte Pulse for evaluation and management of high triglycerides.  This is apparently a longstanding problem and she has been on different medications for in the past.  Particularly she has been on simvastatin and more recently on fenofibrate.  Her last lipids were assessed in October showing total cholesterol 219, HDL 40, LDL 53 and triglycerides 897.  She reports triglycerides over thousand in the past.  She says that her father had heart disease and died of an MI in his 94s but was a heavy smoker.  He also had high cholesterol.  She is physically active still participating in spin classes but reports she gets short of breath and fatigue much easier.  She also has a history of thyroid disorder with partial thyroidectomy on thyroid replacement followed by Dr. Chalmers Cater.  She denies tobacco or alcohol use.  She reports a varied diet but does not necessarily restrict saturated fats to low levels.  She denies history of pancreatitis.  05/20/2020  Julia Cole returns today for follow-up.  Unfortunately she says she was never contacted about Lovaza.  She said she tried to contact the pharmacy but never got a call back.  Subsequently she decided to start taking over-the-counter fish oil on her own.  She remains on fenofibrate and simvastatin.  She was also supposed to have lipids prior to follow-up but she did not have those performed as well.  She is not fasting today.  10/03/2020  Julia Cole is seen today in follow-up.  She has had further improvement in her triglycerides after adding ezetimibe.  She had a calcium score which was mildly elevated 28.7,  56 percentile for age and sex matched controls.  High repeat lipids in July showed total cholesterol of 174, HDL 42, triglycerides 373 and LDL 73.  Triglycerides have come down from 700 in the past.  Her small LDL particles have increased however from 486 - 707 and there is been an increase in her LDL particle numbers from 562 to 910.  She reports her physical activity has declined somewhat.  03/24/2021  Julia Cole returns today for follow-up.  She had repeat lipids this past Friday however they have not resulted yet.  We did discuss her simvastatin, however because she says she has been having left leg pain that radiates down from her hip to her foot.  It is associated with some cramping that occurs intermittently but was notably worse when taking the simvastatin but improved when stopping it.  She has been on this medicine for some time.  I suspect there is interaction between that, her ezetimibe and fenofibrate however her cholesterol has been well controlled.  Previously she thinks she had taken atorvastatin but it is not listed as an intolerance  07/09/2021  Julia Cole returns today for follow-up.  She was screened for the CORE triglyceride study.  Her triglycerides have historically been in the [phone] range however after adjusting her medications and switching her from simvastatin to rosuvastatin, her triglycerides are now markedly improved at 184.  Total cholesterol 174, HDL 52, LDL 91 and LDL particle #1257.  She seems to be tolerating this medication much better.  01/09/2022  Julia Cole returns today for follow-up.  Her lipids are overall much improved.  Her LDL particle number down down to 736, LDL-C of 69, HDL of 42 and triglycerides however have gone up to 361.  She reports less physical activity which may be the responsible factor for this.  Diet has been fairly stable.  Blood pressure is well controlled.  She is asymptomatic.  PMHx:  Past Medical History:  Diagnosis Date   CMT  (Charcot-Marie-Tooth disease)    High triglycerides    Hypertension    Hypothyroidism    Thyroid cancer ([place])     No past surgical history on file.  FAMHx:  Family History  Problem Relation Age of Onset   Heart attack Mother     SOCHx:   reports that she has never smoked. She has never used smokeless tobacco. No history on file for alcohol use and drug use.  ALLERGIES:  No Known Allergies  ROS: Pertinent items noted in HPI and remainder of comprehensive ROS otherwise negative.  HOME MEDS: Current Outpatient Medications on File Prior to Visit  Medication Sig Dispense Refill   ASPIRIN 81 PO Take 1 tablet by mouth every other day.     cholecalciferol (VITAMIN D3) 25 MCG (1000 UNIT) tablet 1 tablet     ezetimibe (ZETIA) 10 MG tablet Take 1 tablet (10 mg total) by mouth daily. 90 tablet 3   fenofibrate 160 MG tablet Take 160 mg by mouth daily.     levothyroxine (SYNTHROID) 88 MCG tablet 1 tablet in the morning on an empty stomach     omega-3 acid ethyl esters (LOVAZA) 1 g capsule Take 2 capsules (2 g total) by mouth 2 (two) times daily. 360 capsule 3   ramipril (ALTACE) 10 MG capsule Take 10 mg by mouth daily.     rosuvastatin (CRESTOR) 20 MG tablet Take 1 tablet (20 mg total) by mouth daily. 90 tablet 3   triamterene-hydrochlorothiazide (MAXZIDE-25) 37.5-25 MG tablet 1 tablet in the morning     Magnesium Oxide 200 MG TABS Take 1 tablet by mouth in the morning and at bedtime. (Patient not taking: Reported on 01/09/2022)     metroNIDAZOLE (FLAGYL) 250 MG tablet Take 250 mg by mouth 3 (three) times daily. (Patient not taking: Reported on 01/09/2022)     No current facility-administered medications on file prior to visit.    LABS/IMAGING: No results found for this or any previous visit (from the past 48 hour(s)). No results found.  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   Ms. Killilea has had further improvement in her lipids.  Her LDL particle numbers and LDL-C are much lower.  Her triglycerides  are gone up a little but this might be related to less physical activity.  She is going to try to continue with more regular cardio and strength training which should help as well with the dietary reduction in saturated fats.  Plan repeat lipid in 6 months and follow-up afterwards. She did screen for the CORE research trial, however, trigs had improved to much to qualify.  Pixie Casino, MD, [facility], [place] Director of the Advanced Lipid Disorders &  Cardiovascular Risk Reduction Clinic Diplomate of the American Board of Clinical Lipidology Attending Cardiologist  Direct Dial: [phone]  Fax: [phone]  Website:  [email or web address] 01/09/2022, 8:13 AM

## 2022-01-09 NOTE — Patient Instructions (Signed)
Medication Instructions:  NO CHANGES  *If you need a refill on your cardiac medications before your next appointment, please call your pharmacy*   Lab Work: FASTING lab work to check cholesterol in about 6 months   If you have labs (blood work) drawn today and your tests are completely normal, you will receive your results only by: MyChart Message (if you have MyChart) OR A paper copy in the mail If you have any lab test that is abnormal or we need to change your treatment, we will call you to review the results.    Follow-Up: At [facility], you and your health needs are our priority.  As part of our continuing mission to provide you with exceptional heart care, we have created designated Provider Care Teams.  These Care Teams include your primary Cardiologist (physician) and Advanced Practice Providers (APPs -  Physician Assistants and Nurse Practitioners) who all work together to provide you with the care you need, when you need it.  We recommend signing up for the patient portal called "MyChart".  Sign up information is provided on this After Visit Summary.  MyChart is used to connect with patients for Virtual Visits (Telemedicine).  Patients are able to view lab/test results, encounter notes, upcoming appointments, etc.  Non-urgent messages can be sent to your provider as well.   To learn more about what you can do with MyChart, go to [email or web address].    Your next appointment:    6 months with Dr. Hilty 

## 2022-01-19 DIAGNOSIS — M810 Age-related osteoporosis without current pathological fracture: Secondary | ICD-10-CM | POA: Diagnosis not present

## 2022-03-11 DIAGNOSIS — Z03818 Encounter for observation for suspected exposure to other biological agents ruled out: Secondary | ICD-10-CM | POA: Diagnosis not present

## 2022-03-11 DIAGNOSIS — B349 Viral infection, unspecified: Secondary | ICD-10-CM | POA: Diagnosis not present

## 2022-03-24 DIAGNOSIS — R197 Diarrhea, unspecified: Secondary | ICD-10-CM | POA: Diagnosis not present

## 2022-03-24 DIAGNOSIS — J209 Acute bronchitis, unspecified: Secondary | ICD-10-CM | POA: Diagnosis not present

## 2022-03-27 ENCOUNTER — Other Ambulatory Visit: Payer: Self-pay | Admitting: Family Medicine

## 2022-03-27 ENCOUNTER — Ambulatory Visit
Admission: RE | Admit: 2022-03-27 | Discharge: 2022-03-27 | Disposition: A | Discharge status: Home / Self Care | Payer: Medicare Other | Source: Ambulatory Visit | Attending: Family Medicine | Admitting: Family Medicine

## 2022-03-27 DIAGNOSIS — R059 Cough, unspecified: Secondary | ICD-10-CM | POA: Diagnosis not present

## 2022-03-27 DIAGNOSIS — J811 Chronic pulmonary edema: Secondary | ICD-10-CM

## 2022-04-01 ENCOUNTER — Other Ambulatory Visit: Payer: Self-pay | Admitting: Internal Medicine

## 2022-04-02 ENCOUNTER — Other Ambulatory Visit: Payer: Self-pay | Admitting: Internal Medicine

## 2022-04-28 DIAGNOSIS — M810 Age-related osteoporosis without current pathological fracture: Secondary | ICD-10-CM | POA: Diagnosis not present

## 2022-05-18 DIAGNOSIS — E039 Hypothyroidism, unspecified: Secondary | ICD-10-CM | POA: Diagnosis not present

## 2022-05-18 DIAGNOSIS — R300 Dysuria: Secondary | ICD-10-CM | POA: Diagnosis not present

## 2022-05-18 DIAGNOSIS — J42 Unspecified chronic bronchitis: Secondary | ICD-10-CM | POA: Diagnosis not present

## 2022-05-18 DIAGNOSIS — I10 Essential (primary) hypertension: Secondary | ICD-10-CM | POA: Diagnosis not present

## 2022-05-18 DIAGNOSIS — Z0000 Encounter for general adult medical examination without abnormal findings: Secondary | ICD-10-CM | POA: Diagnosis not present

## 2022-05-18 DIAGNOSIS — M810 Age-related osteoporosis without current pathological fracture: Secondary | ICD-10-CM | POA: Diagnosis not present

## 2022-05-18 DIAGNOSIS — Z85850 Personal history of malignant neoplasm of thyroid: Secondary | ICD-10-CM | POA: Diagnosis not present

## 2022-05-18 DIAGNOSIS — E782 Mixed hyperlipidemia: Secondary | ICD-10-CM | POA: Diagnosis not present

## 2022-05-18 DIAGNOSIS — J301 Allergic rhinitis due to pollen: Secondary | ICD-10-CM | POA: Diagnosis not present

## 2022-05-18 DIAGNOSIS — R7309 Other abnormal glucose: Secondary | ICD-10-CM | POA: Diagnosis not present

## 2022-07-24 DIAGNOSIS — H2513 Age-related nuclear cataract, bilateral: Secondary | ICD-10-CM | POA: Diagnosis not present

## 2022-07-24 DIAGNOSIS — H52223 Regular astigmatism, bilateral: Secondary | ICD-10-CM | POA: Diagnosis not present

## 2022-09-08 ENCOUNTER — Other Ambulatory Visit: Payer: Self-pay | Admitting: Internal Medicine

## 2022-09-08 DIAGNOSIS — E783 Hyperchylomicronemia: Secondary | ICD-10-CM

## 2022-09-18 DIAGNOSIS — E783 Hyperchylomicronemia: Secondary | ICD-10-CM | POA: Diagnosis not present

## 2022-09-21 LAB — NMR, LIPOPROFILE
Cholesterol, Total: 153 mg/dL (ref 100–199)
HDL Particle Number: 44 umol/L (ref >=30.5)
HDL-C: 43 mg/dL (ref >39)
LDL Particle Number: 879 nmol/L (ref <1000)
LDL Size: 19.6 nm — ABNORMAL LOW (ref >20.5)
LDL-C (NIH Calc): 71 mg/dL (ref 0–99)
LP-IR Score: 77 — ABNORMAL HIGH (ref <=45)
Small LDL Particle Number: 681 nmol/L — ABNORMAL HIGH (ref <=527)
Triglycerides: 238 mg/dL — ABNORMAL HIGH (ref 0–149)

## 2022-09-24 ENCOUNTER — Other Ambulatory Visit: Payer: Self-pay | Admitting: Internal Medicine

## 2022-09-25 ENCOUNTER — Ambulatory Visit: Payer: Medicare Other | Attending: Internal Medicine | Admitting: Internal Medicine

## 2022-09-25 ENCOUNTER — Encounter: Payer: Self-pay | Admitting: Internal Medicine

## 2022-09-25 VITALS — BP 118/62 | HR 71 | Ht 61.0 in | Wt 145.6 lb

## 2022-09-25 DIAGNOSIS — E783 Hyperchylomicronemia: Secondary | ICD-10-CM | POA: Diagnosis not present

## 2022-09-25 DIAGNOSIS — R931 Abnormal findings on diagnostic imaging of heart and coronary circulation: Secondary | ICD-10-CM | POA: Diagnosis not present

## 2022-09-25 NOTE — Progress Notes (Signed)
LIPID CLINIC CONSULT NOTE  Chief Complaint:  Follow-up high triglycerides  Primary Care Physician: Lupita Raider, MD  Primary Cardiologist:  None  HPI:  Coryn Mosso is a 72 y.o. female who is being seen today for the evaluation of high triglycerides at the request of Lupita Raider, MD.  This is a pleasant 72 year old female kindly referred by Dr. Clelia Croft for evaluation and management of high triglycerides.  This is apparently a longstanding problem and she has been on different medications for in the past.  Particularly she has been on simvastatin and more recently on fenofibrate.  Her last lipids were assessed in October showing total cholesterol 219, HDL 40, LDL 53 and triglycerides 897.  She reports triglycerides over thousand in the past.  She says that her father had heart disease and died of an MI in his 39s but was a heavy smoker.  He also had high cholesterol.  She is physically active still participating in spin classes but reports she gets short of breath and fatigue much easier.  She also has a history of thyroid disorder with partial thyroidectomy on thyroid replacement followed by Dr. Talmage Nap.  She denies tobacco or alcohol use.  She reports a varied diet but does not necessarily restrict saturated fats to low levels.  She denies history of pancreatitis.  05/20/2020  Mrs. Mccurley returns today for follow-up.  Unfortunately she says she was never contacted about Lovaza.  She said she tried to contact the pharmacy but never got a call back.  Subsequently she decided to start taking over-the-counter fish oil on her own.  She remains on fenofibrate and simvastatin.  She was also supposed to have lipids prior to follow-up but she did not have those performed as well.  She is not fasting today.  10/03/2020  Mrs. Shepard is seen today in follow-up.  She has had further improvement in her triglycerides after adding ezetimibe.  She had a calcium score which was mildly elevated  28.7, 56 percentile for age and sex matched controls.  High repeat lipids in July showed total cholesterol of 174, HDL 42, triglycerides 373 and LDL 73.  Triglycerides have come down from 700 in the past.  Her small LDL particles have increased however from 486 - 707 and there is been an increase in her LDL particle numbers from 952 to 910.  She reports her physical activity has declined somewhat.  03/24/2021  Mrs. Longbottom returns today for follow-up.  She had repeat lipids this past Friday however they have not resulted yet.  We did discuss her simvastatin, however because she says she has been having left leg pain that radiates down from her hip to her foot.  It is associated with some cramping that occurs intermittently but was notably worse when taking the simvastatin but improved when stopping it.  She has been on this medicine for some time.  I suspect there is interaction between that, her ezetimibe and fenofibrate however her cholesterol has been well controlled.  Previously she thinks she had taken atorvastatin but it is not listed as an intolerance  07/09/2021  Mrs. Artist returns today for follow-up.  She was screened for the CORE triglyceride study.  Her triglycerides have historically been in the [phone] range however after adjusting her medications and switching her from simvastatin to rosuvastatin, her triglycerides are now markedly improved at 184.  Total cholesterol 174, HDL 52, LDL 91 and LDL particle #1257.  She seems to be tolerating this medication much better.  01/09/2022  Mrs. Goetzke returns today for follow-up.  Her lipids are overall much improved.  Her LDL particle number down down to 736, LDL-C of 69, HDL of 42 and triglycerides however have gone up to 361.  She reports less physical activity which may be the responsible factor for this.  Diet has been fairly stable.  Blood pressure is well controlled.  She is asymptomatic.  09/25/2022  Mrs. Trego is seen today in  follow-up.  She continues to do well on her combination lipid-lowering medications.  Her LDL particle number is now 879, LDL-C is 71, HDL-C is 43 and triglycerides are lower at 238.  She does report exercising 5 days a week.  She remains asymptomatic.  PMHx:  Past Medical History:  Diagnosis Date   CMT (Charcot-Marie-Tooth disease)    High triglycerides    Hypertension    Hypothyroidism    Thyroid cancer (HCC)     No past surgical history on file.  FAMHx:  Family History  Problem Relation Age of Onset   Heart attack Mother     SOCHx:   reports that she has never smoked. She has never used smokeless tobacco. No history on file for alcohol use and drug use.  ALLERGIES:  No Known Allergies  ROS: Pertinent items noted in HPI and remainder of comprehensive ROS otherwise negative.  HOME MEDS: Current Outpatient Medications on File Prior to Visit  Medication Sig Dispense Refill   ASPIRIN 81 PO Take 1 tablet by mouth every other day.     cholecalciferol (VITAMIN D3) 25 MCG (1000 UNIT) tablet 1 tablet     ezetimibe (ZETIA) 10 MG tablet TAKE 1 TABLET BY MOUTH EVERY DAY 90 tablet 3   fenofibrate 160 MG tablet Take 160 mg by mouth daily.     levothyroxine (SYNTHROID) 88 MCG tablet 1 tablet in the morning on an empty stomach     Magnesium Oxide 200 MG TABS Take 1 tablet by mouth in the morning and at bedtime.     omega-3 acid ethyl esters (LOVAZA) 1 g capsule Take 2 capsules (2 g total) by mouth 2 (two) times daily. 360 capsule 3   ramipril (ALTACE) 10 MG capsule Take 10 mg by mouth daily.     rosuvastatin (CRESTOR) 20 MG tablet TAKE 1 TABLET BY MOUTH EVERY DAY 90 tablet 1   triamterene-hydrochlorothiazide (MAXZIDE-25) 37.5-25 MG tablet 1 tablet in the morning     metroNIDAZOLE (FLAGYL) 250 MG tablet Take 250 mg by mouth 3 (three) times daily. (Patient not taking: Reported on 09/25/2022)     No current facility-administered medications on file prior to visit.    LABS/IMAGING: No  results found for this or any previous visit (from the past 48 hour(s)). No results found.  LIPID PANEL:    Component Value Date/Time   CHOL 228 (H) 05/27/2020 0905   TRIG 700 (HH) 05/27/2020 0905   HDL 30 (L) 05/27/2020 0905   CHOLHDL 7.6 (H) 05/27/2020 0905   LDLCALC 84 05/27/2020 0905    WEIGHTS: Wt Readings from Last 3 Encounters:  09/25/22 145 lb 9.6 oz (66 kg)  01/09/22 141 lb 9.6 oz (64.2 kg)  07/09/21 138 lb (62.6 kg)    VITALS: BP 118/62   Pulse 71   Ht 5\' 1"  (1.549 m)   Wt 145 lb 9.6 oz (66 kg)   SpO2 95%   BMI 27.51 kg/m   EXAM: Deferred  EKG: Deferred  ASSESSMENT: Hyperchylomicronemia Hypothyroidism with history of thyroid CA and  prior partial thyroidectomy Family history of premature CAD in her father  PLAN: 1.   Ms. Aldaba has had further reduction in her triglycerides with an LDL that is hovering around 70.  Overall very well treated.  No significant side effects with the medications.  Will continue her current therapy.  Plan repeat lipid NMR and follow-up with me in 1 year or sooner as necessary.  Chrystie Nose, MD, [facility], FACP  [place]  [facility]  Medical Director of the Advanced Lipid Disorders &  Cardiovascular Risk Reduction Clinic Diplomate of the American Board of Clinical Lipidology Attending Cardiologist  Direct Dial: [phone]  Fax: [phone]  Website:  [email or web address] Nicely Gizelle Whetsel 09/25/2022, 2:40 PM

## 2022-09-25 NOTE — Patient Instructions (Signed)
Medication Instructions:  NO CHANGES  *If you need a refill on your cardiac medications before your next appointment, please call your pharmacy*   Lab Work: FASTING lab work to check cholesterol in 1 year   If you have labs (blood work) drawn today and your tests are completely normal, you will receive your results only by: MyChart Message (if you have MyChart) OR A paper copy in the mail If you have any lab test that is abnormal or we need to change your treatment, we will call you to review the results.    Follow-Up: At [facility], you and your health needs are our priority.  As part of our continuing mission to provide you with exceptional heart care, we have created designated Provider Care Teams.  These Care Teams include your primary Cardiologist (physician) and Advanced Practice Providers (APPs -  Physician Assistants and Nurse Practitioners) who all work together to provide you with the care you need, when you need it.  We recommend signing up for the patient portal called "MyChart".  Sign up information is provided on this After Visit Summary.  MyChart is used to connect with patients for Virtual Visits (Telemedicine).  Patients are able to view lab/test results, encounter notes, upcoming appointments, etc.  Non-urgent messages can be sent to your provider as well.   To learn more about what you can do with MyChart, go to [email or web address].    Your next appointment:    12 months with Dr. Hilty  

## 2022-11-27 DIAGNOSIS — Z1231 Encounter for screening mammogram for malignant neoplasm of breast: Secondary | ICD-10-CM | POA: Diagnosis not present

## 2022-12-02 DIAGNOSIS — R0981 Nasal congestion: Secondary | ICD-10-CM | POA: Diagnosis not present

## 2022-12-02 DIAGNOSIS — R509 Fever, unspecified: Secondary | ICD-10-CM | POA: Diagnosis not present

## 2022-12-02 DIAGNOSIS — R5383 Other fatigue: Secondary | ICD-10-CM | POA: Diagnosis not present

## 2022-12-02 DIAGNOSIS — J069 Acute upper respiratory infection, unspecified: Secondary | ICD-10-CM | POA: Diagnosis not present

## 2023-01-14 DIAGNOSIS — C73 Malignant neoplasm of thyroid gland: Secondary | ICD-10-CM | POA: Diagnosis not present

## 2023-01-14 DIAGNOSIS — E890 Postprocedural hypothyroidism: Secondary | ICD-10-CM | POA: Diagnosis not present

## 2023-01-14 DIAGNOSIS — I10 Essential (primary) hypertension: Secondary | ICD-10-CM | POA: Diagnosis not present

## 2023-01-21 DIAGNOSIS — C73 Malignant neoplasm of thyroid gland: Secondary | ICD-10-CM | POA: Diagnosis not present

## 2023-01-21 DIAGNOSIS — K219 Gastro-esophageal reflux disease without esophagitis: Secondary | ICD-10-CM | POA: Diagnosis not present

## 2023-01-21 DIAGNOSIS — I10 Essential (primary) hypertension: Secondary | ICD-10-CM | POA: Diagnosis not present

## 2023-01-21 DIAGNOSIS — E890 Postprocedural hypothyroidism: Secondary | ICD-10-CM | POA: Diagnosis not present

## 2023-03-09 ENCOUNTER — Other Ambulatory Visit: Payer: Self-pay | Admitting: Internal Medicine

## 2023-04-06 ENCOUNTER — Other Ambulatory Visit: Payer: Self-pay | Admitting: Internal Medicine

## 2023-05-17 DIAGNOSIS — Z0000 Encounter for general adult medical examination without abnormal findings: Secondary | ICD-10-CM | POA: Diagnosis not present

## 2023-05-17 DIAGNOSIS — E782 Mixed hyperlipidemia: Secondary | ICD-10-CM | POA: Diagnosis not present

## 2023-05-17 DIAGNOSIS — M810 Age-related osteoporosis without current pathological fracture: Secondary | ICD-10-CM | POA: Diagnosis not present

## 2023-05-17 DIAGNOSIS — J301 Allergic rhinitis due to pollen: Secondary | ICD-10-CM | POA: Diagnosis not present

## 2023-05-17 DIAGNOSIS — J42 Unspecified chronic bronchitis: Secondary | ICD-10-CM | POA: Diagnosis not present

## 2023-05-17 DIAGNOSIS — Z85850 Personal history of malignant neoplasm of thyroid: Secondary | ICD-10-CM | POA: Diagnosis not present

## 2023-05-17 DIAGNOSIS — E039 Hypothyroidism, unspecified: Secondary | ICD-10-CM | POA: Diagnosis not present

## 2023-05-17 DIAGNOSIS — I10 Essential (primary) hypertension: Secondary | ICD-10-CM | POA: Diagnosis not present

## 2023-05-17 DIAGNOSIS — R7309 Other abnormal glucose: Secondary | ICD-10-CM | POA: Diagnosis not present

## 2023-05-17 LAB — LAB REPORT - SCANNED
A1c: 5.9
Albumin, Urine POC: <0.7
Creatinine, POC: 68 mg/dL
EGFR: 63
Microalb Creat Ratio: <10.3

## 2023-06-15 DIAGNOSIS — S8991XA Unspecified injury of right lower leg, initial encounter: Secondary | ICD-10-CM | POA: Diagnosis not present

## 2023-07-08 DIAGNOSIS — I10 Essential (primary) hypertension: Secondary | ICD-10-CM | POA: Diagnosis not present

## 2023-07-08 DIAGNOSIS — M25561 Pain in right knee: Secondary | ICD-10-CM | POA: Diagnosis not present

## 2023-08-16 DIAGNOSIS — M810 Age-related osteoporosis without current pathological fracture: Secondary | ICD-10-CM | POA: Diagnosis not present

## 2023-09-26 ENCOUNTER — Other Ambulatory Visit: Payer: Self-pay | Admitting: Internal Medicine

## 2023-09-28 NOTE — Telephone Encounter (Signed)
 Rx(s) sent to pharmacy electronically.

## 2023-10-04 ENCOUNTER — Encounter (HOSPITAL_BASED_OUTPATIENT_CLINIC_OR_DEPARTMENT_OTHER): Admitting: Nurse Practitioner

## 2023-10-25 ENCOUNTER — Other Ambulatory Visit: Payer: Self-pay | Admitting: Internal Medicine

## 2023-11-09 DIAGNOSIS — H18413 Arcus senilis, bilateral: Secondary | ICD-10-CM | POA: Diagnosis not present

## 2023-11-09 DIAGNOSIS — H40032 Anatomical narrow angle, left eye: Secondary | ICD-10-CM | POA: Diagnosis not present

## 2023-11-09 DIAGNOSIS — H40033 Anatomical narrow angle, bilateral: Secondary | ICD-10-CM | POA: Diagnosis not present

## 2023-11-09 DIAGNOSIS — H2513 Age-related nuclear cataract, bilateral: Secondary | ICD-10-CM | POA: Diagnosis not present

## 2023-11-09 DIAGNOSIS — H2512 Age-related nuclear cataract, left eye: Secondary | ICD-10-CM | POA: Diagnosis not present

## 2023-11-09 DIAGNOSIS — H25043 Posterior subcapsular polar age-related cataract, bilateral: Secondary | ICD-10-CM | POA: Diagnosis not present

## 2023-11-16 ENCOUNTER — Encounter (HOSPITAL_BASED_OUTPATIENT_CLINIC_OR_DEPARTMENT_OTHER): Admitting: Nurse Practitioner

## 2023-11-16 DIAGNOSIS — H40031 Anatomical narrow angle, right eye: Secondary | ICD-10-CM | POA: Diagnosis not present

## 2023-11-22 ENCOUNTER — Telehealth: Payer: Self-pay | Admitting: Internal Medicine

## 2023-11-22 DIAGNOSIS — E783 Hyperchylomicronemia: Secondary | ICD-10-CM

## 2023-11-22 DIAGNOSIS — R931 Abnormal findings on diagnostic imaging of heart and coronary circulation: Secondary | ICD-10-CM

## 2023-11-22 NOTE — Telephone Encounter (Signed)
Order placed and let patient know

## 2023-11-22 NOTE — Telephone Encounter (Signed)
 Pt is getting labs done today and the lab tech states they never got her lab order sent in. Please advise. Pt is currently at [facility].

## 2023-11-23 ENCOUNTER — Other Ambulatory Visit: Payer: Self-pay | Admitting: Internal Medicine

## 2023-11-23 DIAGNOSIS — R931 Abnormal findings on diagnostic imaging of heart and coronary circulation: Secondary | ICD-10-CM | POA: Diagnosis not present

## 2023-11-23 DIAGNOSIS — E783 Hyperchylomicronemia: Secondary | ICD-10-CM | POA: Diagnosis not present

## 2023-11-24 LAB — NMR, LIPOPROFILE
Cholesterol, Total: 161 mg/dL (ref 100–199)
HDL Particle Number: 35 umol/L (ref >=30.5)
HDL-C: 39 mg/dL — ABNORMAL LOW (ref >39)
LDL Particle Number: 1032 nmol/L — ABNORMAL HIGH (ref <1000)
LDL Size: 19.7 nm — ABNORMAL LOW (ref >20.5)
LDL-C (NIH Calc): 72 mg/dL (ref 0–99)
LP-IR Score: 80 — ABNORMAL HIGH (ref <=45)
Small LDL Particle Number: 851 nmol/L — ABNORMAL HIGH (ref <=527)
Triglycerides: 313 mg/dL — ABNORMAL HIGH (ref 0–149)

## 2023-11-25 ENCOUNTER — Ambulatory Visit (HOSPITAL_BASED_OUTPATIENT_CLINIC_OR_DEPARTMENT_OTHER): Admitting: Nurse Practitioner

## 2023-11-25 ENCOUNTER — Encounter (HOSPITAL_BASED_OUTPATIENT_CLINIC_OR_DEPARTMENT_OTHER): Payer: Self-pay | Admitting: Nurse Practitioner

## 2023-11-25 VITALS — BP 124/82 | HR 64 | Ht 61.5 in | Wt 153.6 lb

## 2023-11-25 DIAGNOSIS — E783 Hyperchylomicronemia: Secondary | ICD-10-CM | POA: Diagnosis not present

## 2023-11-25 DIAGNOSIS — I700 Atherosclerosis of aorta: Secondary | ICD-10-CM

## 2023-11-25 DIAGNOSIS — M62838 Other muscle spasm: Secondary | ICD-10-CM

## 2023-11-25 DIAGNOSIS — R931 Abnormal findings on diagnostic imaging of heart and coronary circulation: Secondary | ICD-10-CM | POA: Diagnosis not present

## 2023-11-25 DIAGNOSIS — Z634 Disappearance and death of family member: Secondary | ICD-10-CM | POA: Diagnosis not present

## 2023-11-25 DIAGNOSIS — E785 Hyperlipidemia, unspecified: Secondary | ICD-10-CM

## 2023-11-25 NOTE — Patient Instructions (Signed)
 Medication Instructions:   Your physician recommends that you continue on your current medications as directed. Please refer to the Current Medication list given to you today.   *If you need a refill on your cardiac medications before your next appointment, please call your pharmacy*  Lab Work:  Your physician recommends that you return for a FASTING lipid profile 6 months, fasting after midnight, one week before appointment. Patient given paperwork today.    If you have labs (blood work) drawn today and your tests are completely normal, you will receive your results only by: MyChart Message (if you have MyChart) OR A paper copy in the mail If you have any lab test that is abnormal or we need to change your treatment, we will call you to review the results.  Testing/Procedures:   None ordered.  Follow-Up: At [facility], you and your health needs are our priority.  As part of our continuing mission to provide you with exceptional heart care, our providers are all part of one team.  This team includes your primary Cardiologist (physician) and Advanced Practice Providers or APPs (Physician Assistants and Nurse Practitioners) who all work together to provide you with the care you need, when you need it.  Your next appointment:   6 month(s)  Provider:   K. [place] Hilty, MD or Rosaline Bane, NP    We recommend signing up for the patient portal called MyChart.  Sign up information is provided on this After Visit Summary.  MyChart is used to connect with patients for Virtual Visits (Telemedicine).  Patients are able to view lab/test results, encounter notes, upcoming appointments, etc.  Non-urgent messages can be sent to your provider as well.   To learn more about what you can do with MyChart, go to [email or web address].   Other Instructions  Your physician wants you to follow-up in: 6 months.  You will receive a reminder letter in the mail two months in advance. If you  don't receive a letter, please call our office to schedule the follow-up appointment.   Tackling Obesity with Lifestyle Changes  Obesity- What is it? And What can we do about it?  Obesity is a chronic complex disease defined as excessive fat deposits that can have a negative effect on our health. It can lead to many other diseases including type 2 diabetes.  Weight gain occurs when the amount of energy (calories) we consume is greater than the amount we use.  When our energy output is greater than our energy input we lose weight. The basic concept is simple, but in reality, it's much more complicated.  Unfortunately, in some people, our bodies have many ways it can compensate when we try to eat less and move more which can prevent us  from changing our weight. This can lead to some people having a much more difficult time losing weight even when they put healthy habits into practice. This can be frustrating. We want to focus on healthy habits, physical activity and how we feel, and less the number on the scale.  Food As Energy  Calories  Calories is just a unit of measurement for energy.  Counting calories is not required to lose weight but counting for a short period of time can:   help you learn good portion sizes   Learn what your true energy needs are.   Help you be more aware of your snacking or grazing habits  To help calculate how many calories you should be eating, the NIH  has a great body weight planner calculator at BeverageBuggy.si  Types of Energy Expenditure  Basal Metabolic Rate (BMR) Energy that our bodies use to preform everyday tasks. More muscle mass through resistance training can increase this a small amount  Thermic Effect of Food The amount of energy that it takes to breakdown the food we eat. This will be highest when we eat protein and fiber rich foods  Exercise Energy Expenditure The amount of energy used during formal exercise (walking, biking,  weightlifting)  Non-exercise activity thermogenesis (NEAT) The amount of energy spent on activities that are not formal exercise (standing, fidgeting). Therefore, it is not only important to do formal exercise but also move around throughout the day.  Managing The Meal  Macro nutrients (carbohydrates, fats and protein, fiber, water)  Micronutrients (vitamins, minerals)  Dietary Fiber  Benefits Examples Cautions  Soluble fiber  Decreases cholesterol  improve blood sugar control,  Feeds our gut bacteria  Allows us  to feel fuller for longer so we eat less  fruits  oats  barley  legumes  peas  Beans  vegetables (broccoli) and root vegetables (carrots) Add fiber into your diet slowly and be sure to drink at least 8 cups of water a day. This will help limit gas, bloating, diarrhea, or constipation.  Insoluble Fiber  Improves digestive health by making stool easier to pass  Allows us  to feel fuller for longer so we eat less  whole grains  nuts  seeds  skin of fruit  vegetables (green beans, zucchini, cauliflower)  Tricks to add more fiber to your diet   Add beans (pinto, kidney, lima, navy and garbanzo) to salads, ground meat or brown rice   Add nuts or seeds and or fresh/frozen fruit to yogurt, cottage cheese, salads or steel cut oats   Cut up vegetables and eat with hummus   Look for unsweetened whole grain cereals with at least 5g of fiber per serving   Switch to whole grain bread. Look for bread that has whole grain flour as the first ingredient and has more fiber than carbs if you were to multiple the fiber x 10.   Try bulgar, barely, quinoa, buckwheat, brown rice wild rice instead of white rice   Keep frozen vegetables on hand to add to dishes or soups  Meal Planning:  Meal planning is the key to setting you up for success. Here are some examples of healthy meal options.  Breakfast  Option 1: Omelette with vegetables (1 egg, spinach, mushrooms, or other  vegetable of your choice), 2 slices whole-grain toast, tip of thumb size butter or soft margarine,  cup low-fat milk or yogurt  Option 2: steel-cut rolled oats (? cup dry), 1 tbsp peanut butter added to cooked oats,  cup low-fat milk.  Option 3: 2 slices whole-grain or rye toast with avocado spread ( small avocado mased with herbs and pepper to taste), 1 poached egg or [place] up (cooked to your liking)  Option 4:  cup plain 0% [place] yogurt topped with  cup berries and  cup walnuts or almonds, 2 slices whole-grain or rye toast, tip of thumb size soft margarine/butter  Lunch:  Option 1: 2 cups red lentil soup, green salad with 1 tbsp homemade vinaigrette (extra virgin olive oil and vinegar of choice plus spices)  Option 2: 3 oz. roasted chicken, 2 slices whole-grain bread, 2 tsp mayonnaise, mustard, lettuce, tomato if desired, 1 fruit (example: medium-sized apple or small pear)  Option 3: 3 oz. tuna  packed in water, 1 whole-wheat pita (6 inch), 2 tsp mayonnaise, lettuce, tomato, or other non-starchy vegetable of your choice, 1 fruit (example: medium-sized apple or small pear)  Option 4: 1 serving of garden veggie buddha bowl with lentils and tahini sauce and 1 cup berries topped with  cup plain 0% Greek yogurt  Dinner:  Option 1: 1 serving roasted cauliflower salad, 3-4 oz. grilled or baked pork loin chop, 1/2 cup mashed potato, or brown rice or quinoa  Option 2: 1 serving fish (baked, grilled or air fried), green salad, 1 tbsp homemade vinaigrette,  cup cooked couscous  Option 3: 1 cup cooked whole grained pasta (example: spaghetti, spirals, macaroni),  cup favorite pasta sauce (preferably homemade), 3-4 oz. grilled or baked chicken, green salad, 1 tbsp homemade vinaigrette  Option 4: 1 serving oven roasted salmon,  cup mashed sweet potato or couscous or brown rice or quinoa, broccoli (steamed or roasted)  Healthy snacks:   Carrots or celery with 1 tbsp of hummus   1  medium-sized fruit (apple or orange)   1 cup plain 0% [place] yogurt with  cup berries   Half apple, sliced, with 1 tbsp (15 mL) peanut or almond butter  Dining out:  Eating away from home has become a part of many people's lifestyle. Making healthy choices when you are eating out is important too. Portion size is an important part of healthy choices. Most branded fast-food places provide calories, sodium, and fat content for their menu items. www.calorieking.com would be great resource to find nutrition facts for your favorite brands and fast-food restaurants. Company specific website can be Chief Technology Officer for nutrition information for their items. (e.g. www.mcdonalds.com or www.nutritionix.com/[place]/menu/premium)  Here are some tips to help you make wise food choices when you are dining out.  Chose more often Avoid  Beverages   Choose more often: Water, low fat milk  Sugar-free/diet drinks  Unsweet tea or coffee    Avoid: Milkshakes, fruit drinks, regular pop  Alcohol, specialty drinks (e.g. iced cappuccino)  Fast food  Choose more often:  Garden salad  Mini subs, pita sandwiches ect with extra vegetables  plain burgers, grilled chicken  Vegetarian or cheese pizza with whole-grain crust    Avoid: Burgers/sandwiches with bacon, cheese, and high-fat sauces  [place] fries, fried chicken, fried fish, poutine, hash browns  Pizza with processed meats  Starters   Choose more often: Raw vegetables, salads (garden, spinach, fruit)  clear or vegetable soups  Seafood cocktail  Whole-grain breads and rolls    Avoid: Salads with high-fat dressings or toppings  Creamy soups  Wings, egg rolls  onion rings, nachos  White or garlic bread  Main courses Grains & Starches (amount equal to  of your plate)  Choose more often:  Oatmeal, high-fiber/lower-sugar cereals  Whole-grain breads, rice, pasta, barley, couscous  Sweet potatoes    Avoid: Sugary, low-fiber cereals   Large bagels, muffins, croissants, white bread  [place] fries, hash browns, fried rice   Meat and alternative (amount equal to  of your plate)  Choose more often:  Lean meats, poultry, fish, eggs, low-fat cheese  Tofu, vegetable protein Legumes (e.g. lentils, chickpeas, beans)    Avoid: High-salt and/or high-fat meats (e.g. ribs, wings, sausages, wieners, processed lunch meats, imposter meats)   Vegetables (amount equal to  of your plate)  Choose more often:  Salads ([place], garden, spinach), plain vegetables   Avoid:  Salads with creamy, high-fat dressings and   Vegetables on sandwiches ect toppings like  bacon bits, croutons, cheese  Desserts  Choose more often:  Fresh fruit, frozen yogourt, skim milk latte    Avoid: Cakes, pies, pastries, ice cream, cheesecake

## 2023-11-25 NOTE — Progress Notes (Signed)
 " Cardiology Office Note   Date:  11/25/2023  ID:  Julia Cole, DOB [birth year], MRN [id number] PCP: Loreli Kins, MD  [facility] Providers Cardiologist:  None     PMH Hyperchylomicronemia Family history heart disease Partial thyroidectomy with subsequent hypothyroid Coronary artery calcification CT calcium  score 06/21/2020 CAC score 28.7 (56 percentile) LM 0, LAD 28, LCx 0, RCA 0.7  Referred to advanced lipid disorder clinic and seen by Dr. Mona 02/2020.  Long history of elevated triglycerides, she had been on simvastatin and more recently had been started on fibrate.  Triglycerides were 897, LDL 53.  She reported history of triglycerides over 1000 in the past.  Her father had an MI in his 41s but was a heavy smoker.  He did have history of high cholesterol.  She was advised to start Lovaza  but never got this medication from the pharmacy.  Ezetimibe  was added and she had further improvement in lipids with total cholesterol 174, HDL 42, triglycerides 373, and LDL 73.  At follow-up 03/24/2021 she was having leg pain and cramping which improved when she held simvastatin.  She was changed to rosuvastatin , resulting in marked improvement in triglycerides to 184. She was screened for CORE triglyceride study.   Last clinic visit was 09/25/2022.  She continued to do well on Lovaza , rosuvastatin , ezetimibe , and fenofibrate. She was exercising 5 days a week and was asymptomatic.   Lipid panel completed 11/23/2023 revealed LDL particle #1032, LDL-C 72, HDL-C 39, triglycerides 313, total cholesterol 161, and small LDL-P 851.  History of Present Illness Discussed the use of AI scribe software for clinical note transcription with the patient, who gave verbal consent to proceed.  History of Present Illness Julia Cole is a very pleasant 73 year old female who presents for follow-up of hyperlipidemia. She experiences abdominal spasms that occur randomly, described as a  rolling and tightening sensation in her left rib area, severe enough to cause soreness for days. These spasms can double her over and are sometimes accompanied by queasiness but no nausea. They primarily occur when sitting and do not worsen with exercise. Fatigue and lack of motivation have been present since her husband's passing earlier this year. She experiences variable energy levels, with some days of easy fatigue and shortness of breath after minimal activity. She maintains physical activity by going to the gym three times a week, engaging in cardio exercises like walking on the treadmill and riding a bike. No chest pain occurs during exercise. She has high triglycerides, previously recorded at 313 mg/dL, and is on Lovaza  and fenofibrate 160 mg. Her diet has been suboptimal, often including fast food and snacks, and she aims to improve it and return to a healthier weight.  She is not having any adverse effects from medication.  No concerning cardiac symptoms.  ROS: See HPI  Studies Reviewed EKG Interpretation Date/Time:  Thursday November 25 2023 11:28:04 EDT Ventricular Rate:  64 PR Interval:  142 QRS Duration:  72 QT Interval:  430 QTC Calculation: 443 R Axis:   35  Text Interpretation: Normal sinus rhythm Normal ECG When compared with ECG of 29-Mar-2006 06:19, No significant change was found Confirmed by Percy Browning [phone]) on 11/25/2023 11:39:47 AM     No results found for: LIPOA  Risk Assessment/Calculations           Physical Exam VS:  BP 124/82 (BP Location: Left Arm, Patient Position: Sitting, Cuff Size: Normal)   Pulse 64   Ht 5' 1.5 (  1.562 m)   Wt 153 lb 9.6 oz (69.7 kg)   SpO2 98%   BMI 28.55 kg/m    Wt Readings from Last 3 Encounters:  11/25/23 153 lb 9.6 oz (69.7 kg)  09/25/22 145 lb 9.6 oz (66 kg)  01/09/22 141 lb 9.6 oz (64.2 kg)    GEN: Well nourished, well developed in no acute distress NECK: No JVD; No carotid bruits CARDIAC: RRR, no murmurs,  rubs, gallops RESPIRATORY:  Clear to auscultation without rales, wheezing or rhonchi  ABDOMEN: Soft, non-tender, non-distended EXTREMITIES:  No edema; No deformity    Assessment & Plan Hypertriglyceridemia   Dyslipidemia LDL goal < 70 Lipid panel completed 11/23/2023 with LDL particle #1032, LDL-C 72, HDL-C 39, triglycerides 313, total cholesterol 161, and small LDL-P 851. Triglycerides previously as high as 1075. No concerning side effects from rosuvastatin , Lovaza , ezetimibe , or fenofibrate. She admits to poor dietary habits recently due to bereavement.  LDL is well-controlled. -Encouraged dietary modifications to reduce processed foods, sugars, and simple carbohydrates -Increase intake of fiber, vegetables, fruits, and lean proteins -Encourage physical activity, including walking after meals and aiming for at least 150 minutes of moderate intensity exercise each week -Recheck lipid panel in 6 months - Continue Lovaza , ezetimibe , fenofibrate, and rosuvastatin   Coronary artery calcification Aortic atherosclerosis CT calcium  score 06/21/2020 with CAC of 28.7 (56 percentile).  EKG today reveals normal sinus rhythm at 64 bpm, no ST/T abnormality. She denies chest pain, dyspnea, or other symptoms concerning for angina.  No indication for further ischemic evaluation at this time. - Continue lipid-lowering therapy - Continue aspirin - Lifestyle modification to include more physical activity and heart healthy diet  Abdominal muscle spasms   Intermittent spasms occur with no clear association to meals or exertion.  We discussed red flag symptoms to report that are concerning for cardiac issues. -Recommend follow-up with PCP  Depressive symptoms following bereavement   She notes lack of motivation and fatigue since the passing of her husband earlier this year.  She is hesitant to start medication but is open to it if symptoms persist. Consider Lexapro or Zoloft if symptoms do not improve.  She  reports a good support system.  Encouragement provided. -Follow up with primary care physician for further management         Dispo: 6 months with Dr. Mona or me  Signed, Rosaline Bane, NP-C "

## 2023-11-29 ENCOUNTER — Ambulatory Visit: Payer: Self-pay | Admitting: Internal Medicine

## 2023-12-03 DIAGNOSIS — Z1231 Encounter for screening mammogram for malignant neoplasm of breast: Secondary | ICD-10-CM | POA: Diagnosis not present

## 2024-03-05 ENCOUNTER — Other Ambulatory Visit: Payer: Self-pay | Admitting: Internal Medicine
